# Patient Record
Sex: Male | Born: 1955 | Race: Black or African American | Hispanic: No | Marital: Single | Smoking: Current every day smoker
Health system: Southern US, Community
[De-identification: ages and names within clinical notes are randomized; demographics above are authoritative.]

## PROBLEM LIST (undated history)

## (undated) DIAGNOSIS — F419 Anxiety disorder, unspecified: Secondary | ICD-10-CM

## (undated) DIAGNOSIS — N183 Chronic kidney disease, stage 3 unspecified: Secondary | ICD-10-CM

## (undated) DIAGNOSIS — I10 Essential (primary) hypertension: Secondary | ICD-10-CM

## (undated) HISTORY — DX: Chronic kidney disease, stage 3 unspecified: N18.30

---

## 2003-11-01 ENCOUNTER — Emergency Department (HOSPITAL_COMMUNITY): Admission: EM | Admit: 2003-11-01 | Discharge: 2003-11-01 | Payer: Self-pay | Admitting: *Deleted

## 2004-04-12 ENCOUNTER — Emergency Department (HOSPITAL_COMMUNITY): Admission: EM | Admit: 2004-04-12 | Discharge: 2004-04-12 | Payer: Self-pay | Admitting: Family Medicine

## 2004-04-15 ENCOUNTER — Emergency Department (HOSPITAL_COMMUNITY): Admission: EM | Admit: 2004-04-15 | Discharge: 2004-04-15 | Payer: Self-pay | Admitting: Family Medicine

## 2007-03-04 ENCOUNTER — Emergency Department (HOSPITAL_COMMUNITY): Admission: EM | Admit: 2007-03-04 | Discharge: 2007-03-05 | Payer: Self-pay | Admitting: Emergency Medicine

## 2007-03-05 ENCOUNTER — Ambulatory Visit: Payer: Self-pay | Admitting: *Deleted

## 2007-12-05 ENCOUNTER — Encounter: Admission: RE | Admit: 2007-12-05 | Discharge: 2007-12-05 | Payer: Self-pay | Admitting: Family Medicine

## 2008-07-29 ENCOUNTER — Emergency Department (HOSPITAL_COMMUNITY): Admission: EM | Admit: 2008-07-29 | Discharge: 2008-07-29 | Payer: Self-pay | Admitting: Emergency Medicine

## 2008-11-25 ENCOUNTER — Emergency Department (HOSPITAL_COMMUNITY): Admission: EM | Admit: 2008-11-25 | Discharge: 2008-11-26 | Payer: Self-pay | Admitting: Emergency Medicine

## 2009-09-15 ENCOUNTER — Inpatient Hospital Stay (HOSPITAL_COMMUNITY): Admission: EM | Admit: 2009-09-15 | Discharge: 2009-09-17 | Payer: Self-pay | Admitting: Otolaryngology

## 2009-09-15 ENCOUNTER — Encounter: Payer: Self-pay | Admitting: Emergency Medicine

## 2009-09-27 ENCOUNTER — Ambulatory Visit (HOSPITAL_COMMUNITY): Admission: RE | Admit: 2009-09-27 | Discharge: 2009-09-27 | Payer: Self-pay | Admitting: Otolaryngology

## 2009-10-07 ENCOUNTER — Ambulatory Visit (HOSPITAL_COMMUNITY): Admission: RE | Admit: 2009-10-07 | Discharge: 2009-10-07 | Payer: Self-pay | Admitting: Otolaryngology

## 2009-10-18 ENCOUNTER — Ambulatory Visit (HOSPITAL_BASED_OUTPATIENT_CLINIC_OR_DEPARTMENT_OTHER): Admission: RE | Admit: 2009-10-18 | Discharge: 2009-10-18 | Payer: Self-pay | Admitting: Otolaryngology

## 2010-05-20 LAB — POCT I-STAT, CHEM 8
BUN: 5 mg/dL — ABNORMAL LOW (ref 6–23)
HCT: 45 % (ref 39.0–52.0)
Sodium: 137 mEq/L (ref 135–145)
TCO2: 27 mmol/L (ref 0–100)

## 2010-05-22 LAB — URINALYSIS, ROUTINE W REFLEX MICROSCOPIC
Glucose, UA: NEGATIVE mg/dL
Ketones, ur: NEGATIVE mg/dL
Nitrite: NEGATIVE
Specific Gravity, Urine: 1.018 (ref 1.005–1.030)
pH: 7 (ref 5.0–8.0)

## 2010-05-22 LAB — CBC
MCV: 88.9 fL (ref 78.0–100.0)
Platelets: 229 10*3/uL (ref 150–400)
RBC: 4.44 MIL/uL (ref 4.22–5.81)
RDW: 15.2 % (ref 11.5–15.5)
WBC: 6.5 10*3/uL (ref 4.0–10.5)

## 2010-05-22 LAB — ETHANOL: Alcohol, Ethyl (B): 7 mg/dL (ref 0–10)

## 2010-05-22 LAB — TYPE AND SCREEN
ABO/RH(D): O POS
Antibody Screen: NEGATIVE

## 2010-05-22 LAB — BASIC METABOLIC PANEL
BUN: 19 mg/dL (ref 6–23)
Calcium: 9.5 mg/dL (ref 8.4–10.5)
Chloride: 106 mEq/L (ref 96–112)
Creatinine, Ser: 1.77 mg/dL — ABNORMAL HIGH (ref 0.4–1.5)
GFR calc Af Amer: 49 mL/min — ABNORMAL LOW (ref >60)
GFR calc non Af Amer: 41 mL/min — ABNORMAL LOW (ref >60)

## 2010-05-22 LAB — DIFFERENTIAL
Basophils Absolute: 0 10*3/uL (ref 0.0–0.1)
Lymphocytes Relative: 28 % (ref 12–46)
Lymphs Abs: 1.8 10*3/uL (ref 0.7–4.0)
Neutro Abs: 4.2 10*3/uL (ref 1.7–7.7)
Neutrophils Relative %: 64 % (ref 43–77)

## 2010-05-22 LAB — RAPID URINE DRUG SCREEN, HOSP PERFORMED
Barbiturates: NOT DETECTED
Benzodiazepines: NOT DETECTED
Cocaine: NOT DETECTED

## 2010-05-22 LAB — PROTIME-INR: Prothrombin Time: 14.7 seconds (ref 11.6–15.2)

## 2010-06-10 LAB — CBC
HCT: 41.2 % (ref 39.0–52.0)
MCV: 91.6 fL (ref 78.0–100.0)
Platelets: 220 10*3/uL (ref 150–400)
RDW: 15.2 % (ref 11.5–15.5)

## 2010-06-10 LAB — COMPREHENSIVE METABOLIC PANEL
AST: 16 U/L (ref 0–37)
Albumin: 3.9 g/dL (ref 3.5–5.2)
BUN: 11 mg/dL (ref 6–23)
Calcium: 8.8 mg/dL (ref 8.4–10.5)
Creatinine, Ser: 0.86 mg/dL (ref 0.4–1.5)
GFR calc Af Amer: >60 mL/min (ref >60)
Total Protein: 6.7 g/dL (ref 6.0–8.3)

## 2010-06-10 LAB — DIFFERENTIAL
Basophils Absolute: 0 K/uL (ref 0.0–0.1)
Basophils Relative: 0 % (ref 0–1)
Eosinophils Absolute: 0.3 K/uL (ref 0.0–0.7)
Eosinophils Relative: 3 % (ref 0–5)
Lymphocytes Relative: 19 % (ref 12–46)
Lymphs Abs: 2 K/uL (ref 0.7–4.0)
Monocytes Absolute: 0.5 K/uL (ref 0.1–1.0)
Monocytes Relative: 5 % (ref 3–12)
Neutro Abs: 7.7 K/uL (ref 1.7–7.7)
Neutrophils Relative %: 72 % (ref 43–77)

## 2010-06-10 LAB — URINALYSIS, ROUTINE W REFLEX MICROSCOPIC
Glucose, UA: NEGATIVE mg/dL
Hgb urine dipstick: NEGATIVE
Protein, ur: NEGATIVE mg/dL
Urobilinogen, UA: 1 mg/dL (ref 0.0–1.0)

## 2010-06-14 LAB — DIFFERENTIAL
Basophils Absolute: 0 10*3/uL (ref 0.0–0.1)
Basophils Relative: 0 % (ref 0–1)
Eosinophils Absolute: 0.2 10*3/uL (ref 0.0–0.7)
Monocytes Absolute: 0.5 10*3/uL (ref 0.1–1.0)
Neutro Abs: 6 10*3/uL (ref 1.7–7.7)
Neutrophils Relative %: 77 % (ref 43–77)

## 2010-06-14 LAB — URINALYSIS, ROUTINE W REFLEX MICROSCOPIC
Bilirubin Urine: NEGATIVE
Nitrite: NEGATIVE
Protein, ur: NEGATIVE mg/dL
Specific Gravity, Urine: 1.013 (ref 1.005–1.030)
Urobilinogen, UA: 1 mg/dL (ref 0.0–1.0)

## 2010-06-14 LAB — RAPID URINE DRUG SCREEN, HOSP PERFORMED
Opiates: NOT DETECTED
Tetrahydrocannabinol: NOT DETECTED

## 2010-06-14 LAB — CBC
HCT: 43.8 % (ref 39.0–52.0)
Hemoglobin: 14.6 g/dL (ref 13.0–17.0)
MCV: 91 fL (ref 78.0–100.0)
RBC: 4.81 MIL/uL (ref 4.22–5.81)
WBC: 7.8 10*3/uL (ref 4.0–10.5)

## 2010-06-14 LAB — COMPREHENSIVE METABOLIC PANEL
Alkaline Phosphatase: 40 U/L (ref 39–117)
BUN: 10 mg/dL (ref 6–23)
Chloride: 106 mEq/L (ref 96–112)
Glucose, Bld: 103 mg/dL — ABNORMAL HIGH (ref 70–99)
Potassium: 5.3 mEq/L — ABNORMAL HIGH (ref 3.5–5.1)
Total Bilirubin: 1.3 mg/dL — ABNORMAL HIGH (ref 0.3–1.2)

## 2010-06-14 LAB — POCT CARDIAC MARKERS
CKMB, poc: <1 ng/mL — ABNORMAL LOW (ref 1.0–8.0)
Myoglobin, poc: 24.3 ng/mL (ref 12–200)
Troponin i, poc: <0.05 ng/mL (ref 0.00–0.09)

## 2010-09-22 ENCOUNTER — Emergency Department (HOSPITAL_COMMUNITY)
Admission: EM | Admit: 2010-09-22 | Discharge: 2010-09-22 | Disposition: A | Discharge status: Home / Self Care | Payer: Medicare Other | Attending: Emergency Medicine | Admitting: Emergency Medicine

## 2010-09-22 ENCOUNTER — Emergency Department (HOSPITAL_COMMUNITY): Payer: Medicare Other

## 2010-09-22 DIAGNOSIS — F70 Mild intellectual disabilities: Secondary | ICD-10-CM | POA: Insufficient documentation

## 2010-09-22 DIAGNOSIS — Z87820 Personal history of traumatic brain injury: Secondary | ICD-10-CM | POA: Insufficient documentation

## 2010-09-22 DIAGNOSIS — R079 Chest pain, unspecified: Secondary | ICD-10-CM | POA: Insufficient documentation

## 2010-09-22 DIAGNOSIS — Z79899 Other long term (current) drug therapy: Secondary | ICD-10-CM | POA: Insufficient documentation

## 2010-09-22 DIAGNOSIS — I10 Essential (primary) hypertension: Secondary | ICD-10-CM | POA: Insufficient documentation

## 2010-09-22 DIAGNOSIS — F29 Unspecified psychosis not due to a substance or known physiological condition: Secondary | ICD-10-CM | POA: Insufficient documentation

## 2010-09-22 DIAGNOSIS — D649 Anemia, unspecified: Secondary | ICD-10-CM | POA: Insufficient documentation

## 2010-09-22 DIAGNOSIS — R05 Cough: Secondary | ICD-10-CM | POA: Insufficient documentation

## 2010-09-22 DIAGNOSIS — R059 Cough, unspecified: Secondary | ICD-10-CM | POA: Insufficient documentation

## 2014-09-26 ENCOUNTER — Emergency Department (HOSPITAL_COMMUNITY)
Admission: EM | Admit: 2014-09-26 | Discharge: 2014-09-26 | Disposition: A | Discharge status: Home / Self Care | Payer: Medicare Other | Attending: Emergency Medicine | Admitting: Emergency Medicine

## 2014-09-26 ENCOUNTER — Encounter (HOSPITAL_COMMUNITY): Payer: Self-pay | Admitting: Emergency Medicine

## 2014-09-26 DIAGNOSIS — S3992XA Unspecified injury of lower back, initial encounter: Secondary | ICD-10-CM | POA: Diagnosis present

## 2014-09-26 DIAGNOSIS — Y92410 Unspecified street and highway as the place of occurrence of the external cause: Secondary | ICD-10-CM | POA: Insufficient documentation

## 2014-09-26 DIAGNOSIS — Y9389 Activity, other specified: Secondary | ICD-10-CM | POA: Insufficient documentation

## 2014-09-26 DIAGNOSIS — Y998 Other external cause status: Secondary | ICD-10-CM | POA: Diagnosis not present

## 2014-09-26 MED ORDER — IBUPROFEN 800 MG PO TABS: 800.0000 mg | ORAL_TABLET | Freq: Three times a day (TID) | ORAL | Status: DC

## 2014-09-26 MED ORDER — IBUPROFEN 800 MG PO TABS
800.0000 mg | ORAL_TABLET | Freq: Once | ORAL | Status: AC
  Administered 2014-09-26: 800 mg via ORAL
  Filled 2014-09-26: qty 1

## 2014-09-26 MED ORDER — METHOCARBAMOL 500 MG PO TABS: 500.0000 mg | ORAL_TABLET | Freq: Two times a day (BID) | ORAL | Status: DC
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
## 2014-09-26 NOTE — Discharge Instructions (Signed)

## 2015-03-17 ENCOUNTER — Other Ambulatory Visit: Payer: Self-pay | Admitting: Family Medicine

## 2015-03-17 ENCOUNTER — Ambulatory Visit
Admission: RE | Admit: 2015-03-17 | Discharge: 2015-03-17 | Disposition: A | Discharge status: Home / Self Care | Payer: Medicare Other | Source: Ambulatory Visit | Attending: Family Medicine | Admitting: Family Medicine

## 2015-03-17 DIAGNOSIS — M25511 Pain in right shoulder: Secondary | ICD-10-CM

## 2015-06-08 ENCOUNTER — Observation Stay (HOSPITAL_COMMUNITY)
Admission: EM | Admit: 2015-06-08 | Discharge: 2015-06-09 | Disposition: A | Discharge status: Home / Self Care | Payer: Medicare Other | Attending: Internal Medicine | Admitting: Internal Medicine

## 2015-06-08 ENCOUNTER — Emergency Department (HOSPITAL_COMMUNITY): Payer: Medicare Other

## 2015-06-08 ENCOUNTER — Encounter (HOSPITAL_COMMUNITY): Payer: Self-pay | Admitting: Emergency Medicine

## 2015-06-08 DIAGNOSIS — N179 Acute kidney failure, unspecified: Secondary | ICD-10-CM | POA: Diagnosis not present

## 2015-06-08 DIAGNOSIS — N189 Chronic kidney disease, unspecified: Secondary | ICD-10-CM | POA: Diagnosis not present

## 2015-06-08 DIAGNOSIS — I129 Hypertensive chronic kidney disease with stage 1 through stage 4 chronic kidney disease, or unspecified chronic kidney disease: Secondary | ICD-10-CM | POA: Insufficient documentation

## 2015-06-08 DIAGNOSIS — G9340 Encephalopathy, unspecified: Secondary | ICD-10-CM | POA: Diagnosis not present

## 2015-06-08 DIAGNOSIS — E872 Acidosis, unspecified: Secondary | ICD-10-CM

## 2015-06-08 DIAGNOSIS — R4182 Altered mental status, unspecified: Secondary | ICD-10-CM | POA: Diagnosis present

## 2015-06-08 DIAGNOSIS — I959 Hypotension, unspecified: Secondary | ICD-10-CM | POA: Diagnosis not present

## 2015-06-08 DIAGNOSIS — N390 Urinary tract infection, site not specified: Secondary | ICD-10-CM | POA: Insufficient documentation

## 2015-06-08 DIAGNOSIS — I10 Essential (primary) hypertension: Secondary | ICD-10-CM | POA: Diagnosis present

## 2015-06-08 DIAGNOSIS — R8281 Pyuria: Secondary | ICD-10-CM | POA: Diagnosis present

## 2015-06-08 DIAGNOSIS — N289 Disorder of kidney and ureter, unspecified: Secondary | ICD-10-CM

## 2015-06-08 HISTORY — DX: Essential (primary) hypertension: I10

## 2015-06-08 LAB — CBC WITH DIFFERENTIAL/PLATELET
Basophils Absolute: 0 10*3/uL (ref 0.0–0.1)
Basophils Relative: 0 %
EOS ABS: 0.1 10*3/uL (ref 0.0–0.7)
EOS PCT: 1 %
HCT: 42.8 % (ref 39.0–52.0)
Hemoglobin: 14.2 g/dL (ref 13.0–17.0)
LYMPHS ABS: 2.5 10*3/uL (ref 0.7–4.0)
Lymphocytes Relative: 25 %
MCH: 29.4 pg (ref 26.0–34.0)
MCHC: 33.2 g/dL (ref 30.0–36.0)
MCV: 88.6 fL (ref 78.0–100.0)
Monocytes Absolute: 0.5 10*3/uL (ref 0.1–1.0)
Monocytes Relative: 5 %
Neutro Abs: 6.7 10*3/uL (ref 1.7–7.7)
Neutrophils Relative %: 69 %
PLATELETS: 271 10*3/uL (ref 150–400)
RBC: 4.83 MIL/uL (ref 4.22–5.81)
RDW: 15 % (ref 11.5–15.5)
WBC: 9.9 10*3/uL (ref 4.0–10.5)

## 2015-06-08 LAB — COMPREHENSIVE METABOLIC PANEL
ALK PHOS: 51 U/L (ref 38–126)
ALT: 21 U/L (ref 17–63)
AST: 22 U/L (ref 15–41)
Albumin: 4.7 g/dL (ref 3.5–5.0)
Anion gap: 11 (ref 5–15)
BILIRUBIN TOTAL: 0.7 mg/dL (ref 0.3–1.2)
BUN: 23 mg/dL — AB (ref 6–20)
CALCIUM: 9.9 mg/dL (ref 8.9–10.3)
CHLORIDE: 108 mmol/L (ref 101–111)
CO2: 22 mmol/L (ref 22–32)
CREATININE: 2.04 mg/dL — AB (ref 0.61–1.24)
GFR calc Af Amer: 40 mL/min — ABNORMAL LOW (ref >60)
GFR, EST NON AFRICAN AMERICAN: 34 mL/min — AB (ref >60)
Glucose, Bld: 130 mg/dL — ABNORMAL HIGH (ref 65–99)
Potassium: 4.4 mmol/L (ref 3.5–5.1)
Sodium: 141 mmol/L (ref 135–145)
Total Protein: 7.8 g/dL (ref 6.5–8.1)

## 2015-06-08 LAB — LACTIC ACID, PLASMA
LACTIC ACID, VENOUS: 2.4 mmol/L — AB (ref 0.5–2.0)
Lactic Acid, Venous: 1.1 mmol/L (ref 0.5–2.0)

## 2015-06-08 LAB — ETHANOL: Alcohol, Ethyl (B): 12 mg/dL — ABNORMAL HIGH (ref <5)

## 2015-06-08 LAB — CBG MONITORING, ED: GLUCOSE-CAPILLARY: 136 mg/dL — AB (ref 65–99)

## 2015-06-08 NOTE — ED Notes (Signed)
Pt in CT.
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
## 2015-06-08 NOTE — ED Notes (Signed)
Bed: RESA Expected date:  Expected time:  Means of arrival:  Comments: EMS 60yo M N/V syncope

## 2015-06-08 NOTE — ED Notes (Signed)
MD at bedside. 

## 2015-06-08 NOTE — ED Notes (Signed)
Pt reminded of need for urine sample.  

## 2015-06-09 ENCOUNTER — Encounter (HOSPITAL_COMMUNITY): Payer: Self-pay | Admitting: Nephrology

## 2015-06-09 DIAGNOSIS — R8281 Pyuria: Secondary | ICD-10-CM | POA: Diagnosis present

## 2015-06-09 DIAGNOSIS — R4182 Altered mental status, unspecified: Secondary | ICD-10-CM | POA: Diagnosis not present

## 2015-06-09 DIAGNOSIS — N289 Disorder of kidney and ureter, unspecified: Secondary | ICD-10-CM

## 2015-06-09 DIAGNOSIS — N390 Urinary tract infection, site not specified: Secondary | ICD-10-CM

## 2015-06-09 DIAGNOSIS — I9589 Other hypotension: Secondary | ICD-10-CM

## 2015-06-09 DIAGNOSIS — I10 Essential (primary) hypertension: Secondary | ICD-10-CM

## 2015-06-09 DIAGNOSIS — G9340 Encephalopathy, unspecified: Secondary | ICD-10-CM

## 2015-06-09 DIAGNOSIS — N179 Acute kidney failure, unspecified: Secondary | ICD-10-CM

## 2015-06-09 LAB — URINALYSIS, ROUTINE W REFLEX MICROSCOPIC
Bilirubin Urine: NEGATIVE
Glucose, UA: NEGATIVE mg/dL
Ketones, ur: NEGATIVE mg/dL
NITRITE: NEGATIVE
PROTEIN: NEGATIVE mg/dL
SPECIFIC GRAVITY, URINE: 1.021 (ref 1.005–1.030)
pH: 5.5 (ref 5.0–8.0)

## 2015-06-09 LAB — BASIC METABOLIC PANEL
Anion gap: 5 (ref 5–15)
BUN: 19 mg/dL (ref 6–20)
CO2: 23 mmol/L (ref 22–32)
CREATININE: 1.33 mg/dL — AB (ref 0.61–1.24)
Calcium: 9 mg/dL (ref 8.9–10.3)
Chloride: 112 mmol/L — ABNORMAL HIGH (ref 101–111)
GFR calc Af Amer: >60 mL/min (ref >60)
GFR, EST NON AFRICAN AMERICAN: 58 mL/min — AB (ref >60)
Glucose, Bld: 159 mg/dL — ABNORMAL HIGH (ref 65–99)
POTASSIUM: 4.3 mmol/L (ref 3.5–5.1)
SODIUM: 140 mmol/L (ref 135–145)

## 2015-06-09 LAB — CBC
HCT: 36.6 % — ABNORMAL LOW (ref 39.0–52.0)
Hemoglobin: 12.3 g/dL — ABNORMAL LOW (ref 13.0–17.0)
MCH: 29.2 pg (ref 26.0–34.0)
MCHC: 33.6 g/dL (ref 30.0–36.0)
MCV: 86.9 fL (ref 78.0–100.0)
PLATELETS: 220 10*3/uL (ref 150–400)
RBC: 4.21 MIL/uL — ABNORMAL LOW (ref 4.22–5.81)
RDW: 15 % (ref 11.5–15.5)
WBC: 11 10*3/uL — AB (ref 4.0–10.5)

## 2015-06-09 LAB — URINE MICROSCOPIC-ADD ON

## 2015-06-09 LAB — RAPID URINE DRUG SCREEN, HOSP PERFORMED
Amphetamines: NOT DETECTED
BARBITURATES: NOT DETECTED
Benzodiazepines: NOT DETECTED
COCAINE: NOT DETECTED
Opiates: NOT DETECTED
Tetrahydrocannabinol: POSITIVE — AB

## 2015-06-09 MED ORDER — ONDANSETRON HCL 4 MG/2ML IJ SOLN: 4.0000 mg | Freq: Four times a day (QID) | INTRAMUSCULAR | Status: DC | PRN

## 2015-06-09 MED ORDER — BISACODYL 5 MG PO TBEC: 5.0000 mg | DELAYED_RELEASE_TABLET | Freq: Every day | ORAL | Status: DC | PRN

## 2015-06-09 MED ORDER — ACETAMINOPHEN 325 MG PO TABS: 650.0000 mg | ORAL_TABLET | Freq: Four times a day (QID) | ORAL | Status: DC | PRN

## 2015-06-09 MED ORDER — FLUOXETINE HCL 20 MG PO CAPS
40.0000 mg | ORAL_CAPSULE | Freq: Every day | ORAL | Status: DC
  Administered 2015-06-09: 40 mg via ORAL
  Filled 2015-06-09: qty 2

## 2015-06-09 MED ORDER — SODIUM CHLORIDE 0.45 % IV SOLN
INTRAVENOUS | Status: DC
  Administered 2015-06-09: 150 mL/h via INTRAVENOUS

## 2015-06-09 MED ORDER — ENOXAPARIN SODIUM 40 MG/0.4ML ~~LOC~~ SOLN
40.0000 mg | SUBCUTANEOUS | Status: DC
  Filled 2015-06-09: qty 0.4

## 2015-06-09 MED ORDER — SODIUM CHLORIDE 0.9 % IV BOLUS (SEPSIS)
1000.0000 mL | Freq: Once | INTRAVENOUS | Status: AC
  Administered 2015-06-09: 1000 mL via INTRAVENOUS

## 2015-06-09 MED ORDER — ONDANSETRON HCL 4 MG PO TABS: 4.0000 mg | ORAL_TABLET | Freq: Four times a day (QID) | ORAL | Status: DC | PRN

## 2015-06-09 MED ORDER — SODIUM CHLORIDE 0.9 % IV BOLUS (SEPSIS): 500.0000 mL | Freq: Once | INTRAVENOUS | Status: DC

## 2015-06-09 MED ORDER — POLYETHYLENE GLYCOL 3350 17 G PO PACK: 17.0000 g | PACK | Freq: Every day | ORAL | Status: DC | PRN

## 2015-06-09 MED ORDER — ACETAMINOPHEN 650 MG RE SUPP: 650.0000 mg | Freq: Four times a day (QID) | RECTAL | Status: DC | PRN
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
## 2015-06-09 NOTE — Progress Notes (Signed)
06/08/2015 A. Alric Geise RNCM late entry.  Patient confirms his pcp is Dr. Pecola Leisureeese.  System updated.

## 2015-06-09 NOTE — ED Provider Notes (Signed)
CSN: [id number]     Arrival date & time 06/08/15  2009 History   First MD Initiated Contact with Patient 06/08/15 2021     Chief Complaint  Patient presents with  . Altered Mental Status     (Consider location/radiation/quality/duration/timing/severity/associated sxs/prior Treatment) Patient is a 60 y.o. male presenting with altered mental status.  Altered Mental Status Presenting symptoms: behavior changes and confusion   Severity:  Mild Most recent episode:  Today Episode history:  Single Timing:  Constant Chronicity:  New Context: alcohol use and drug use   Associated symptoms: vomiting   Associated symptoms: no abdominal pain, no fever and no headaches     Past Medical History  Diagnosis Date  . Hypertension    History reviewed. No pertinent past surgical history. No family history on file. Social History  Substance Use Topics  . Smoking status: Never Smoker   . Smokeless tobacco: None  . Alcohol Use: No    Review of Systems  Constitutional: Negative for fever, chills and activity change.  HENT: Negative for congestion and rhinorrhea.   Eyes: Negative for visual disturbance.  Respiratory: Negative for cough and shortness of breath.   Cardiovascular: Negative for chest pain.  Gastrointestinal: Positive for vomiting. Negative for abdominal pain, diarrhea and constipation.  Endocrine: Negative for polyuria.  Genitourinary: Negative for dysuria and flank pain.  Musculoskeletal: Negative for back pain and neck pain.  Skin: Negative for wound.  Neurological: Negative for headaches.  Psychiatric/Behavioral: Positive for confusion.      Allergies  Review of patient's allergies indicates no known allergies.  Home Medications   Prior to Admission medications   Medication Sig Start Date End Date Taking? Authorizing Provider  FLUoxetine (PROZAC) 40 MG capsule Take 40 mg by mouth daily. 06/01/15  Yes Historical Provider, MD  lisinopril (PRINIVIL,ZESTRIL) 20 MG tablet  Take 20 mg by mouth daily. 05/20/15  Yes Historical Provider, MD   BP 128/83 mmHg  Pulse 77  Temp(Src) 98.3 F (36.8 C) (Oral)  Resp 18  SpO2 100% Physical Exam  Constitutional: He appears well-developed and well-nourished.  HENT:  Head: Normocephalic and atraumatic.  Eyes: Conjunctivae are normal. Pupils are equal, round, and reactive to light.  Neck: Normal range of motion.  Cardiovascular: Normal rate.   Pulmonary/Chest: Effort normal. No respiratory distress. He has no wheezes.  Abdominal: Soft. He exhibits no distension.  Musculoskeletal: Normal range of motion. He exhibits no edema or tenderness.  Neurological: No cranial nerve deficit.  Skin: Skin is warm and dry.  Nursing note and vitals reviewed.   ED Course  Procedures (including critical care time) Labs Review Labs Reviewed  COMPREHENSIVE METABOLIC PANEL - Abnormal; Notable for the following:    Glucose, Bld 130 (*)    BUN 23 (*)    Creatinine, Ser 2.04 (*)    GFR calc non Af Amer 34 (*)    GFR calc Af Amer 40 (*)    All other components within normal limits  LACTIC ACID, PLASMA - Abnormal; Notable for the following:    Lactic Acid, Venous 2.4 (*)    All other components within normal limits  URINE RAPID DRUG SCREEN, HOSP PERFORMED - Abnormal; Notable for the following:    Tetrahydrocannabinol POSITIVE (*)    All other components within normal limits  URINALYSIS, ROUTINE W REFLEX MICROSCOPIC (NOT AT [facility]) - Abnormal; Notable for the following:    APPearance CLOUDY (*)    Hgb urine dipstick TRACE (*)    Leukocytes, UA TRACE (*)  All other components within normal limits  ETHANOL - Abnormal; Notable for the following:    Alcohol, Ethyl (B) 12 (*)    All other components within normal limits  URINE MICROSCOPIC-ADD ON - Abnormal; Notable for the following:    Squamous Epithelial / LPF 0-5 (*)    Bacteria, UA FEW (*)    Casts HYALINE CASTS (*)    All other components within normal limits  CBG MONITORING, ED  - Abnormal; Notable for the following:    Glucose-Capillary 136 (*)    All other components within normal limits  URINE CULTURE  CBC WITH DIFFERENTIAL/PLATELET  LACTIC ACID, PLASMA  BASIC METABOLIC PANEL  CBC  CBG MONITORING, ED    Imaging Review Dg Shoulder Right  06/08/2015  CLINICAL DATA:  Right shoulder pain. EXAM: RIGHT SHOULDER - 2+ VIEW COMPARISON:  None. FINDINGS: Negative for acute fracture dislocation. No bone lesion or bony destruction. There is moderate arthritic change of the [facility] joint. The glenohumeral articulation is much better preserved. IMPRESSION: Negative for acute fracture or dislocation Electronically Signed   By: Ellery Plunkaniel R Mitchell M.D.   On: 06/08/2015 21:02   Ct Head Wo Contrast  06/08/2015  CLINICAL DATA:  Change in mental status and vomiting. EXAM: CT HEAD WITHOUT CONTRAST TECHNIQUE: Contiguous axial images were obtained from the base of the skull through the vertex without intravenous contrast. COMPARISON:  09/15/2009 FINDINGS: The brain demonstrates no evidence of hemorrhage, infarction, edema, mass effect, extra-axial fluid collection, hydrocephalus or mass lesion. The skull is unremarkable. IMPRESSION: Normal head CT. Electronically Signed   By: Irish LackGlenn  Yamagata M.D.   On: 06/08/2015 20:56   I have personally reviewed and evaluated these images and lab results as part of my medical decision-making.   EKG Interpretation None      MDM   Final diagnoses:  AKI (acute kidney injury) (HCC)  Altered mental status, unspecified altered mental status type  Hypotension, unspecified hypotension type  Lactic acidosis    Here with AMS, initially low BP that resolved with fluids. Slight AKI, lactic acidosis. MS improved prior to admission but still groggy. No fever to suggest meningitis. Ct negative for bleed. Possibly just drug related.    Marily MemosJason Dellar Traber, MD 06/09/15 [phone]
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
## 2015-06-09 NOTE — Progress Notes (Signed)
Pt's vitals are WNL, tolerating diet and pain is under control. Discussed discharge instructions with patient. Discharged to home.  

## 2015-06-10 LAB — URINE CULTURE

## 2015-12-07 LAB — GLUCOSE, POCT (MANUAL RESULT ENTRY): POC GLUCOSE: 92 mg/dL (ref 70–99)

## 2016-07-24 IMAGING — CR DG SHOULDER 2+V*R*
2 series · 2 of 2 positions shown · non-contrast
Comparison: None.

CLINICAL DATA: Right shoulder pain.

EXAM:
RIGHT SHOULDER - 2+ VIEW

[x shoulder ap right (1 of 2)]
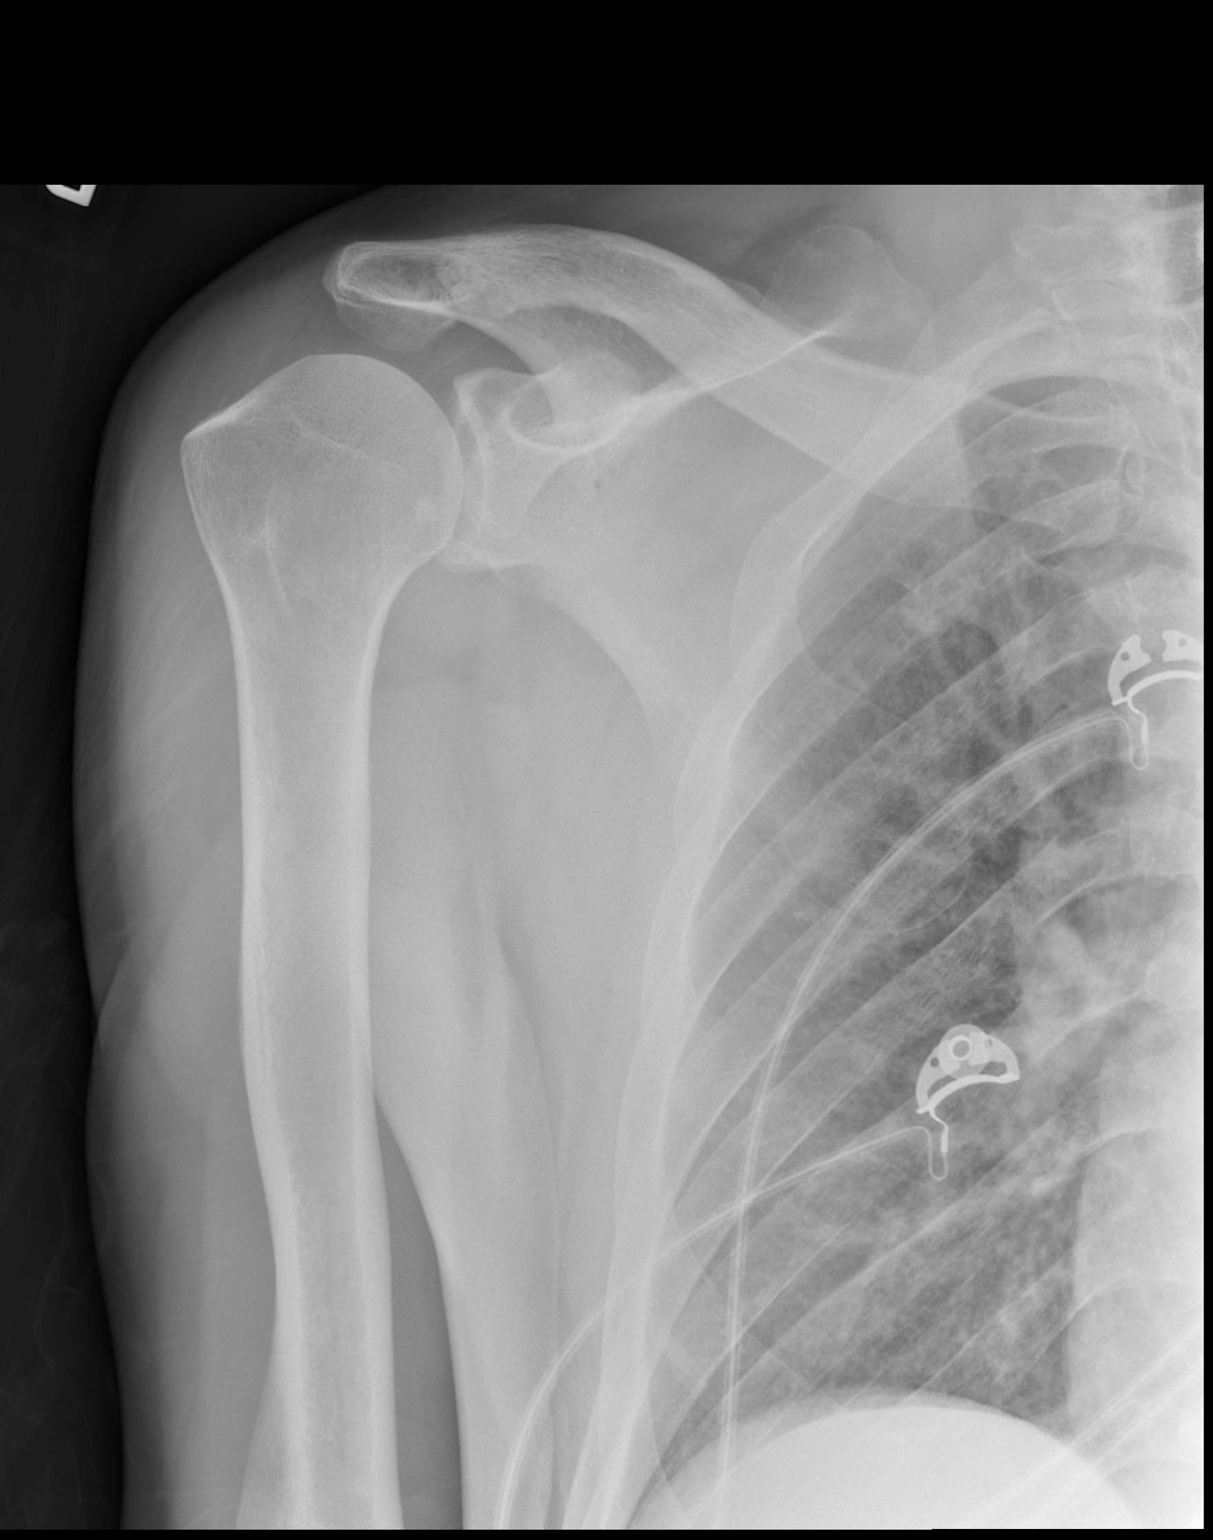

[x shoulder ap right (2 of 2)]
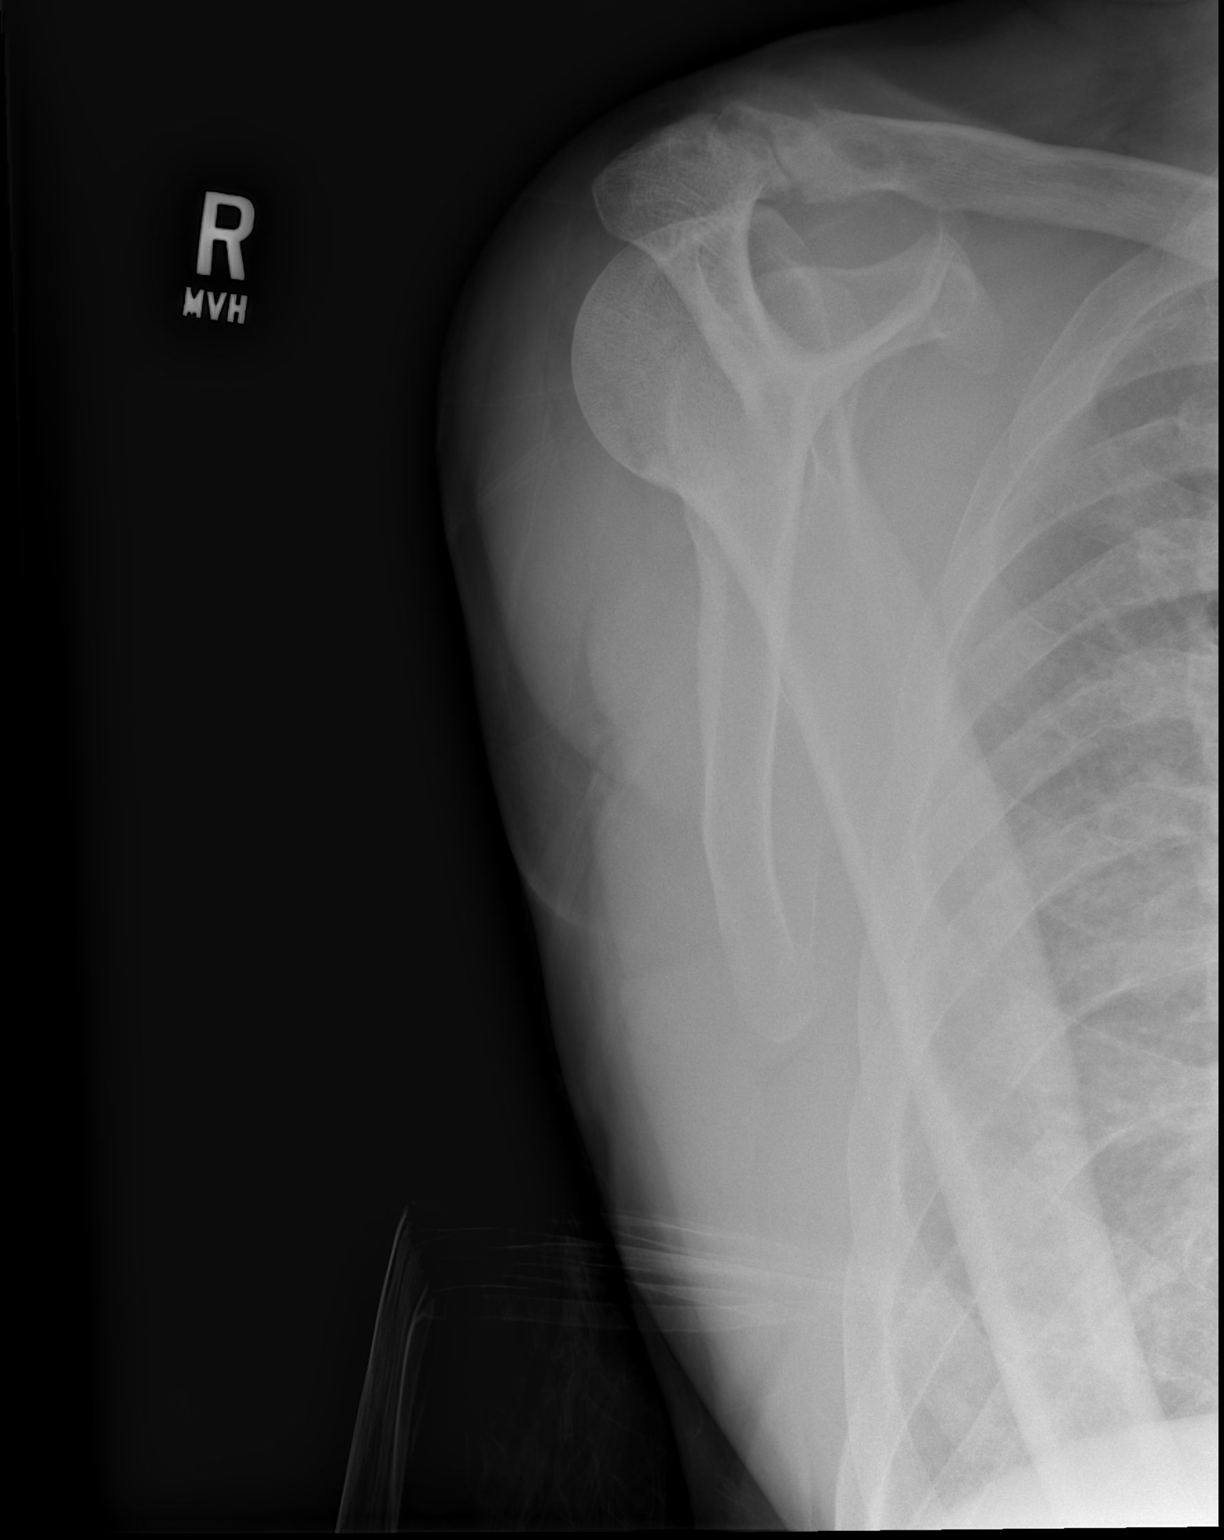

[2 of 2 positions shown; findings below may reference images not displayed]

FINDINGS: Negative for acute fracture dislocation. No bone lesion or bony
destruction. There is moderate arthritic change of the AC joint. The
glenohumeral articulation is much better preserved.
IMPRESSION: Negative for acute fracture or dislocation

## 2016-07-24 IMAGING — CT CT HEAD W/O CM
2 series · 17 of 30 positions shown, 20 images · non-contrast
Comparison: 09/15/2009

CLINICAL DATA: Change in mental status and vomiting.

EXAM:
CT HEAD WITHOUT CONTRAST
TECHNIQUE: Contiguous axial images were obtained from the base of the skull
through the vertex without intravenous contrast.

[Series 2: head w/o · axial · non-contrast · 0.42mm/px · z∈[-266,-146]mm · 9 of 32 slices shown, 12 images]
[im 4/32  brain]
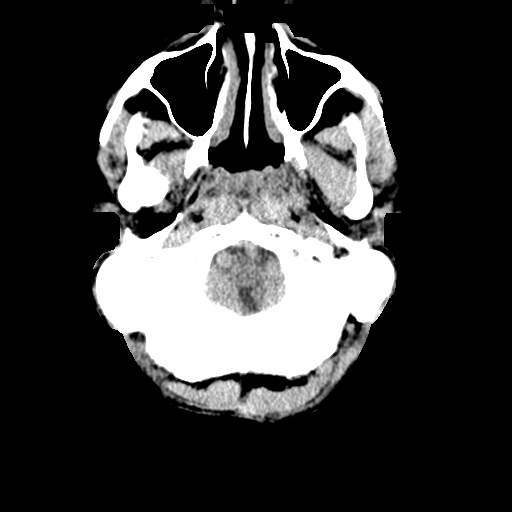
[im 4/32  bone]
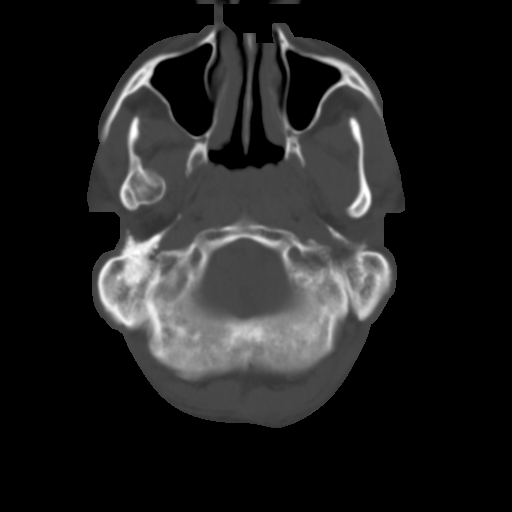
[im 7/32  brain]
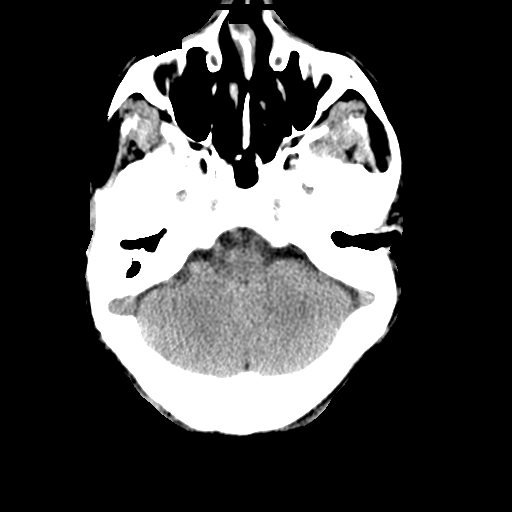
[im 10/32  brain]
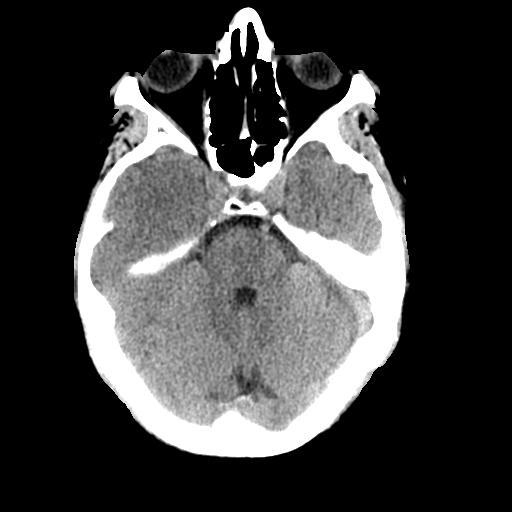
[im 13/32  brain]
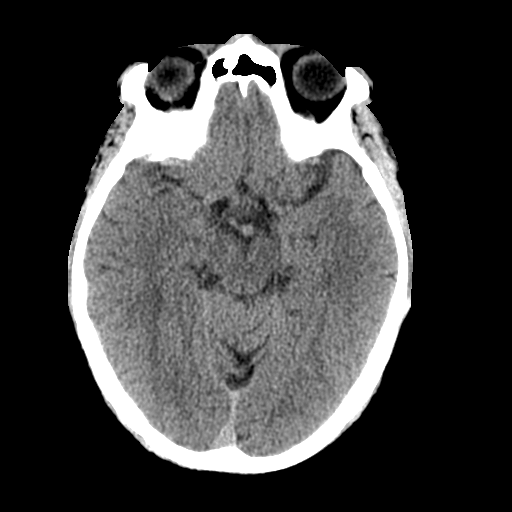
[im 16/32  brain]
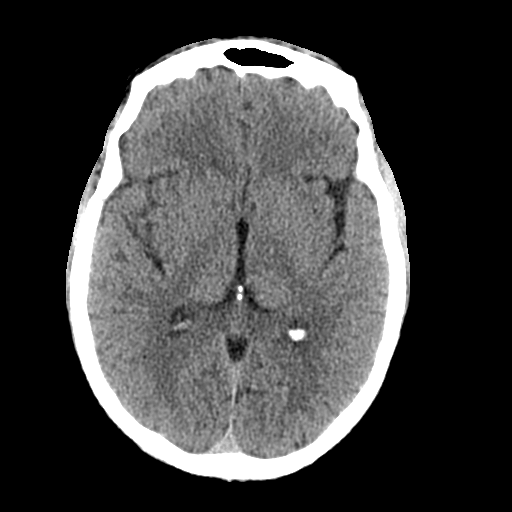
[im 16/32  bone]
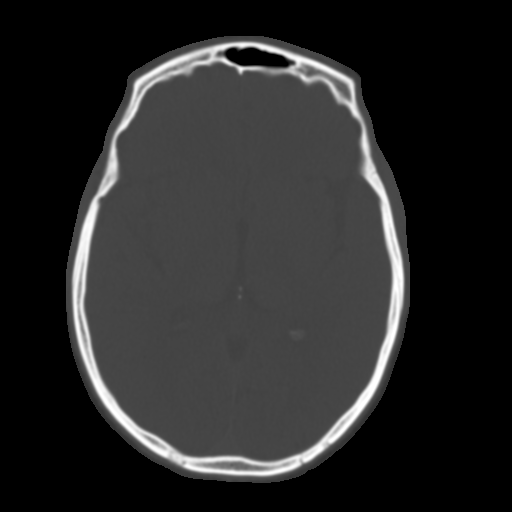
[im 19/32  brain]
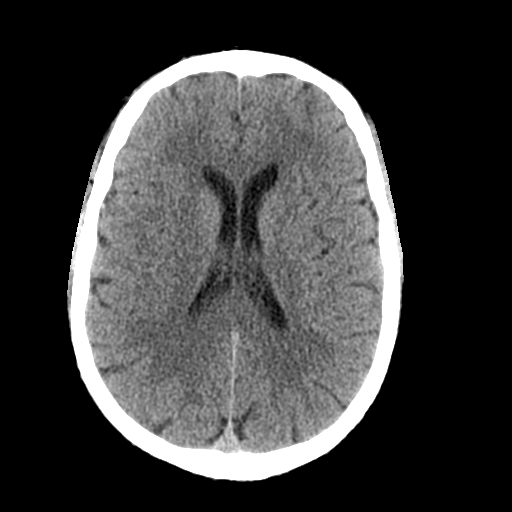
[im 22/32  brain]
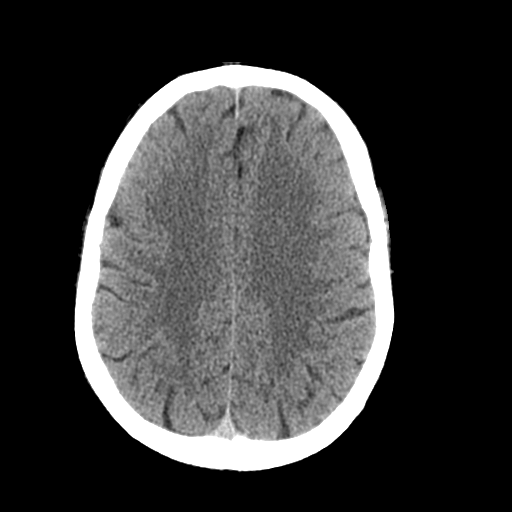
[im 25/32  brain]
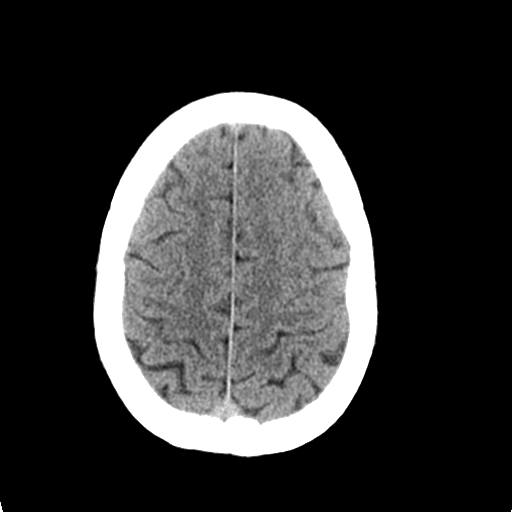
[im 28/32  brain]
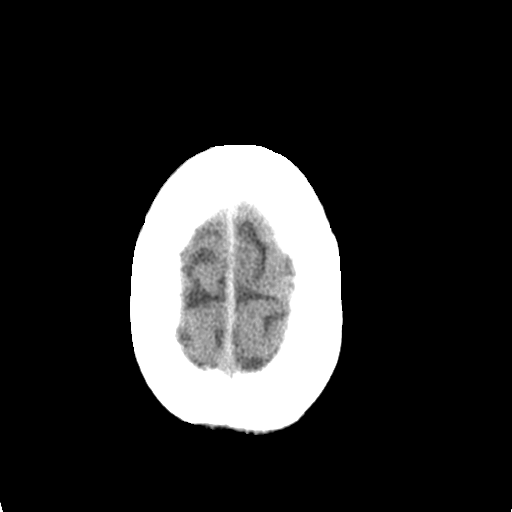
[im 28/32  bone]
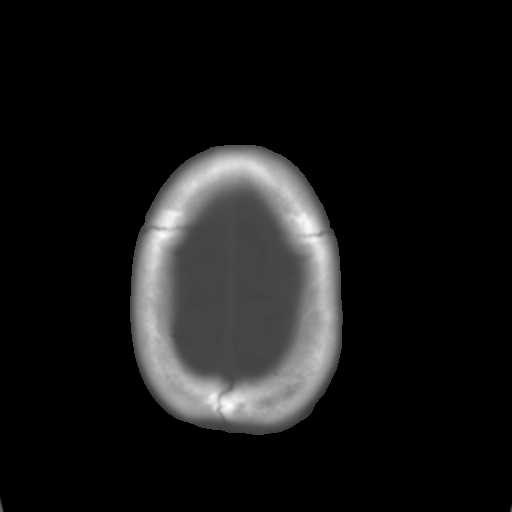

[Series 3: bone windows · axial · 0.42mm/px · z∈[-266,-140]mm · 8 of 54 slices shown]
[im 6/54  bone]
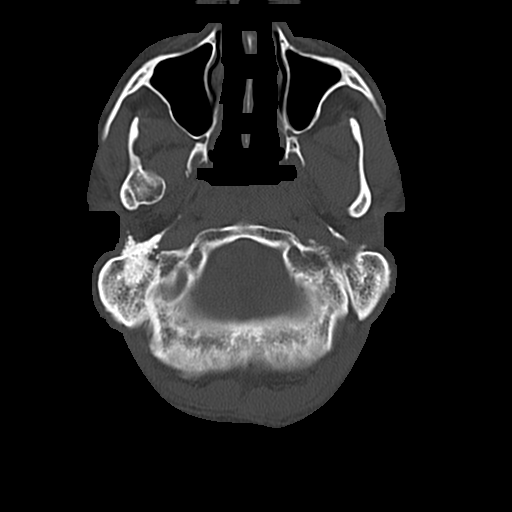
[im 12/54  bone]
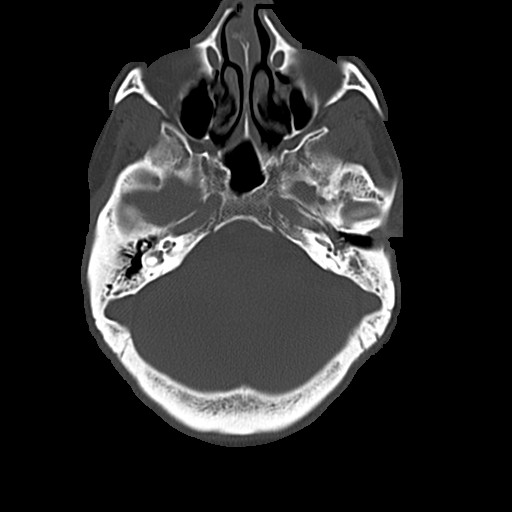
[im 18/54  bone]
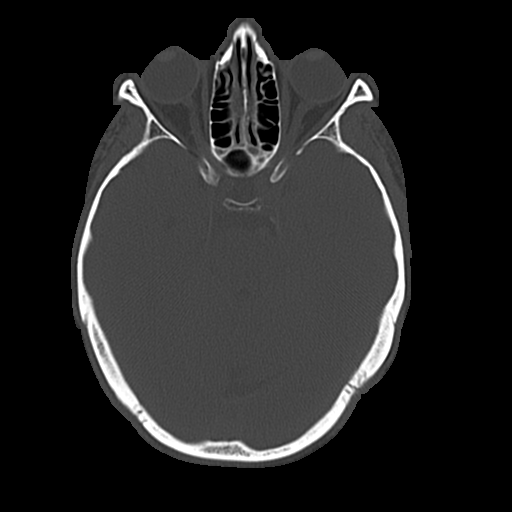
[im 24/54  bone]
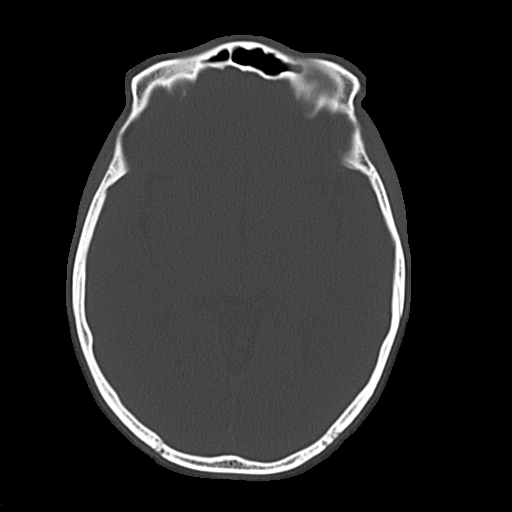
[im 30/54  bone]
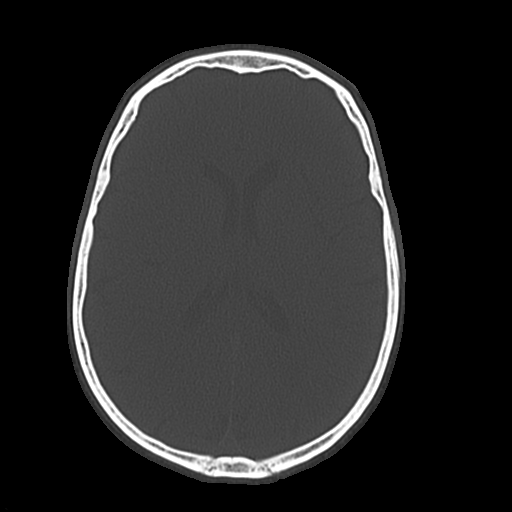
[im 36/54  bone]
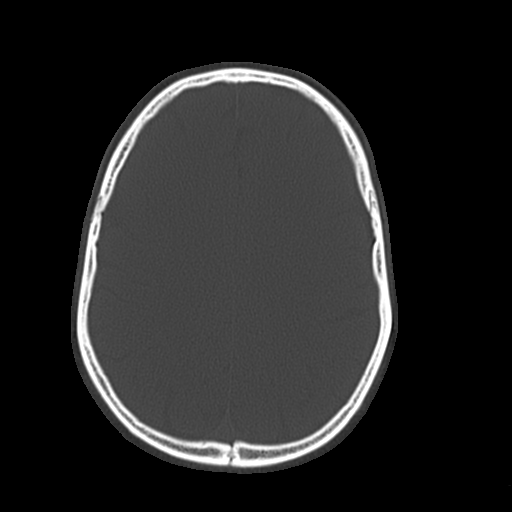
[im 42/54  bone]
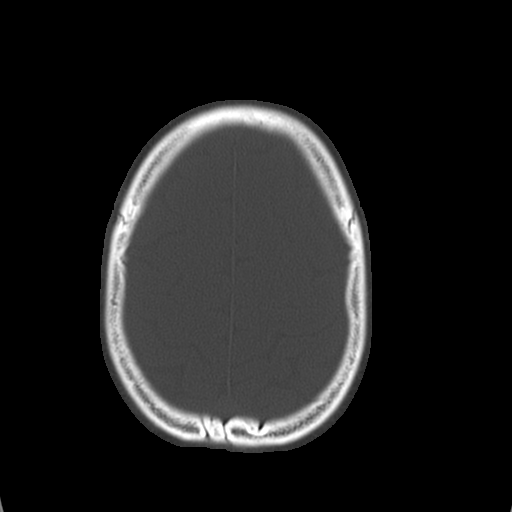
[im 48/54  bone]
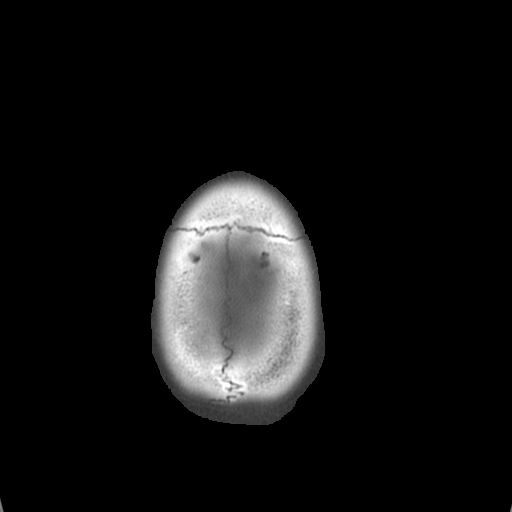

[17 of 30 positions shown; findings below may reference images not displayed]

FINDINGS: The brain demonstrates no evidence of hemorrhage, infarction, edema,
mass effect, extra-axial fluid collection, hydrocephalus or mass
lesion. The skull is unremarkable.
IMPRESSION: Normal head CT.

## 2016-08-20 ENCOUNTER — Emergency Department (HOSPITAL_COMMUNITY): Payer: Medicare Other

## 2016-08-20 ENCOUNTER — Encounter (HOSPITAL_COMMUNITY): Payer: Self-pay | Admitting: Emergency Medicine

## 2016-08-20 ENCOUNTER — Emergency Department (HOSPITAL_COMMUNITY)
Admission: EM | Admit: 2016-08-20 | Discharge: 2016-08-20 | Disposition: A | Discharge status: Home / Self Care | Payer: Medicare Other | Attending: Emergency Medicine | Admitting: Emergency Medicine

## 2016-08-20 DIAGNOSIS — R55 Syncope and collapse: Secondary | ICD-10-CM | POA: Insufficient documentation

## 2016-08-20 DIAGNOSIS — T671XXA Heat syncope, initial encounter: Secondary | ICD-10-CM

## 2016-08-20 DIAGNOSIS — I10 Essential (primary) hypertension: Secondary | ICD-10-CM | POA: Diagnosis not present

## 2016-08-20 DIAGNOSIS — Z79899 Other long term (current) drug therapy: Secondary | ICD-10-CM | POA: Insufficient documentation

## 2016-08-20 HISTORY — DX: Anxiety disorder, unspecified: F41.9

## 2016-08-20 LAB — CBC WITH DIFFERENTIAL/PLATELET
BASOS ABS: 0 10*3/uL (ref 0.0–0.1)
BASOS PCT: 0 %
Eosinophils Absolute: 0.2 10*3/uL (ref 0.0–0.7)
Eosinophils Relative: 3 %
HEMATOCRIT: 41.1 % (ref 39.0–52.0)
Hemoglobin: 13.6 g/dL (ref 13.0–17.0)
LYMPHS PCT: 23 %
Lymphs Abs: 2.1 10*3/uL (ref 0.7–4.0)
MCH: 29.6 pg (ref 26.0–34.0)
MCHC: 33.1 g/dL (ref 30.0–36.0)
MCV: 89.5 fL (ref 78.0–100.0)
MONO ABS: 0.3 10*3/uL (ref 0.1–1.0)
Monocytes Relative: 4 %
NEUTROS ABS: 6.4 10*3/uL (ref 1.7–7.7)
Neutrophils Relative %: 70 %
Platelets: 237 10*3/uL (ref 150–400)
RBC: 4.59 MIL/uL (ref 4.22–5.81)
RDW: 15.4 % (ref 11.5–15.5)
WBC: 9.1 10*3/uL (ref 4.0–10.5)

## 2016-08-20 LAB — COMPREHENSIVE METABOLIC PANEL
ALT: 14 U/L — ABNORMAL LOW (ref 17–63)
ANION GAP: 8 (ref 5–15)
AST: 20 U/L (ref 15–41)
Albumin: 3.8 g/dL (ref 3.5–5.0)
Alkaline Phosphatase: 47 U/L (ref 38–126)
BILIRUBIN TOTAL: 0.8 mg/dL (ref 0.3–1.2)
BUN: 11 mg/dL (ref 6–20)
CO2: 22 mmol/L (ref 22–32)
Calcium: 9.5 mg/dL (ref 8.9–10.3)
Chloride: 110 mmol/L (ref 101–111)
Creatinine, Ser: 1.29 mg/dL — ABNORMAL HIGH (ref 0.61–1.24)
GFR calc Af Amer: >60 mL/min (ref >60)
GFR, EST NON AFRICAN AMERICAN: 60 mL/min — AB (ref >60)
Glucose, Bld: 107 mg/dL — ABNORMAL HIGH (ref 65–99)
POTASSIUM: 3.7 mmol/L (ref 3.5–5.1)
Sodium: 140 mmol/L (ref 135–145)
TOTAL PROTEIN: 7 g/dL (ref 6.5–8.1)

## 2016-08-20 LAB — I-STAT TROPONIN, ED: TROPONIN I, POC: 0 ng/mL (ref 0.00–0.08)

## 2016-08-20 LAB — CBG MONITORING, ED: GLUCOSE-CAPILLARY: 130 mg/dL — AB (ref 65–99)

## 2016-08-20 LAB — CK: CK TOTAL: 210 U/L (ref 49–397)

## 2016-08-20 MED ORDER — SODIUM CHLORIDE 0.9 % IV BOLUS (SEPSIS)
2000.0000 mL | Freq: Once | INTRAVENOUS | Status: AC
  Administered 2016-08-20: 2000 mL via INTRAVENOUS

## 2016-08-20 NOTE — ED Triage Notes (Signed)
EMS called out to [facility] where pt was found to be c/o abd pain, cool, clammy, pale with weak brady pulses; pt was loaded on to EMS stretcher, placed in trendelenburg and given 250cc NS with improvement of symptoms; pt also c/o "bite" to L antecubital space; pt alert and oriented, talking on cell phone during triage assessment

## 2016-08-20 NOTE — ED Provider Notes (Signed)
[facility]PT Provider Note   CSN: [id number] Arrival date & time: 08/20/16  1956     History   Chief Complaint Chief Complaint  Patient presents with  . Near Syncope  . Bradycardia    HPI Roy Chavez is a 61 y.o. male.  The patient had been walking in the hot sun for about 2 hours. Still dizzy and passed out. Patient was brought to the emergency department for evaluation    Loss of Consciousness   This is a new problem. The current episode started less than 1 hour ago. The problem occurs rarely. The problem has been resolved. There was no loss of consciousness. The problem is associated with normal activity. Associated symptoms include dizziness. Pertinent negatives include abdominal pain, back pain, chest pain, congestion, headaches, seizures and visual change.    Past Medical History:  Diagnosis Date  . Anxiety   . Hypertension     Patient Active Problem List   Diagnosis Date Noted  . AKI (acute kidney injury) (HCC) 06/09/2015  . Altered mental status 06/09/2015  . Pyuria 06/09/2015  . Hypertension 06/09/2015  . Acute renal insufficiency 06/09/2015    History reviewed. No pertinent surgical history.     Home Medications    Prior to Admission medications   Medication Sig Start Date End Date Taking? Authorizing Provider  FLUoxetine (PROZAC) 40 MG capsule Take 40 mg by mouth daily. 06/01/15  Yes [provider]  lisinopril (PRINIVIL,ZESTRIL) 20 MG tablet Take 20 mg by mouth daily. 05/20/15  Yes [provider]  Multiple Vitamin (MULTIVITAMIN WITH MINERALS) TABS tablet Take 1 tablet by mouth daily. One A Day   Yes [provider]    Family History History reviewed. No pertinent family history.  Social History Social History  Substance Use Topics  . Smoking status: Never Smoker  . Smokeless tobacco: Not on file  . Alcohol use No     Allergies   Patient has no known allergies.   Review of Systems Review of  Systems  Constitutional: Negative for appetite change and fatigue.  HENT: Negative for congestion, ear discharge and sinus pressure.   Eyes: Negative for discharge.  Respiratory: Negative for cough.   Cardiovascular: Positive for syncope. Negative for chest pain.  Gastrointestinal: Negative for abdominal pain and diarrhea.  Genitourinary: Negative for frequency and hematuria.  Musculoskeletal: Negative for back pain.  Skin: Negative for rash.  Neurological: Positive for dizziness. Negative for seizures and headaches.  Psychiatric/Behavioral: Negative for hallucinations.     Physical Exam Updated Vital Signs BP (!) 142/104   Pulse 73   Temp 98.3 F (36.8 C) (Oral)   Resp 10   SpO2 100%   Physical Exam  Constitutional: He is oriented to person, place, and time. He appears well-developed.  HENT:  Head: Normocephalic.  Eyes: Conjunctivae and EOM are normal. No scleral icterus.  Neck: Neck supple. No thyromegaly present.  Cardiovascular: Normal rate and regular rhythm.  Exam reveals no gallop and no friction rub.   No murmur heard. Pulmonary/Chest: No stridor. He has no wheezes. He has no rales. He exhibits no tenderness.  Abdominal: He exhibits no distension. There is no tenderness. There is no rebound.  Musculoskeletal: Normal range of motion. He exhibits no edema.  Lymphadenopathy:    He has no cervical adenopathy.  Neurological: He is oriented to person, place, and time. He exhibits normal muscle tone. Coordination normal.  Skin: No rash noted. No erythema.  Psychiatric: He has a normal mood and  affect. His behavior is normal.     ED Treatments / Results  Labs (all labs ordered are listed, but only abnormal results are displayed) Labs Reviewed  COMPREHENSIVE METABOLIC PANEL - Abnormal; Notable for the following:       Result Value   Glucose, Bld 107 (*)    Creatinine, Ser 1.29 (*)    ALT 14 (*)    GFR calc non Af Amer 60 (*)    All other components within normal  limits  CBG MONITORING, ED - Abnormal; Notable for the following:    Glucose-Capillary 130 (*)    All other components within normal limits  CBC WITH DIFFERENTIAL/PLATELET  CK  I-STAT TROPOININ, ED    EKG  EKG Interpretation None       Radiology Dg Chest 2 View  Result Date: 08/20/2016 CLINICAL DATA:  Syncope. EXAM: CHEST  2 VIEW COMPARISON:  09/22/2010 FINDINGS: The cardiomediastinal contours are normal. Minimal central bronchial thickening that is chronic. Minimal subsegmental atelectasis in the right midlung. Pulmonary vasculature is normal. No consolidation, pleural effusion, or pneumothorax. No acute osseous abnormalities are seen. IMPRESSION: No active cardiopulmonary disease. Electronically Signed   By: Rubye OaksMelanie  Ehinger M.D.   On: 08/20/2016 21:44   Ct Head Wo Contrast  Result Date: 08/20/2016 CLINICAL DATA:  61 year old male with syncope. History of hypertension. EXAM: CT HEAD WITHOUT CONTRAST TECHNIQUE: Contiguous axial images were obtained from the base of the skull through the vertex without intravenous contrast. COMPARISON:  Head CT dated 06/08/2015 FINDINGS: Brain: No evidence of acute infarction, hemorrhage, hydrocephalus, extra-axial collection or mass lesion/mass effect. Vascular: No hyperdense vessel or unexpected calcification. Skull: Normal. Negative for fracture or focal lesion. Sinuses/Orbits: The visualized paranasal sinuses are clear. There is opacification of the mastoid air cells bilaterally. No air-fluid levels. Other: Irregularity of the nasal bone and nasal septum likely related to old trauma. IMPRESSION: 1. No acute intracranial pathology. 2. Opacification of the mastoid air cells bilaterally. No air-fluid levels. Electronically Signed   By: Elgie CollardArash  Radparvar M.D.   On: 08/20/2016 21:33    Procedures Procedures (including critical care time)  Medications Ordered in ED Medications  sodium chloride 0.9 % bolus 2,000 mL (0 mLs Intravenous Stopped 08/20/16 2202)       Initial Impression / Assessment and Plan / ED Course  I have reviewed the triage vital signs and the nursing notes.  Pertinent labs & imaging results that were available during my care of the patient were reviewed by me and considered in my medical decision making (see chart for details).      Final Clinical Impressions(s) / ED Diagnoses   Final diagnoses:  Heat syncope, initial encounter  Labs unremarkable CT scan normal patient had syncopal episode from his exhaustion. He will follow-up with his PCP  New Prescriptions New Prescriptions   No medications on file     Bethann BerkshireZammit, Jaimie Redditt, MD 08/20/16 2258

## 2016-08-20 NOTE — Discharge Instructions (Signed)
Drink plenty of fluids and follow-up with your doctor this week for recheck °

## 2016-12-16 ENCOUNTER — Emergency Department (HOSPITAL_COMMUNITY): Payer: Medicare Other

## 2016-12-16 ENCOUNTER — Observation Stay (HOSPITAL_COMMUNITY)
Admission: EM | Admit: 2016-12-16 | Discharge: 2016-12-18 | Disposition: A | Discharge status: Home / Self Care | Payer: Medicare Other | Attending: Internal Medicine | Admitting: Internal Medicine

## 2016-12-16 ENCOUNTER — Encounter (HOSPITAL_COMMUNITY): Payer: Self-pay | Admitting: *Deleted

## 2016-12-16 DIAGNOSIS — R410 Disorientation, unspecified: Secondary | ICD-10-CM

## 2016-12-16 DIAGNOSIS — N179 Acute kidney failure, unspecified: Secondary | ICD-10-CM | POA: Insufficient documentation

## 2016-12-16 DIAGNOSIS — R262 Difficulty in walking, not elsewhere classified: Secondary | ICD-10-CM | POA: Diagnosis not present

## 2016-12-16 DIAGNOSIS — I129 Hypertensive chronic kidney disease with stage 1 through stage 4 chronic kidney disease, or unspecified chronic kidney disease: Secondary | ICD-10-CM | POA: Insufficient documentation

## 2016-12-16 DIAGNOSIS — R001 Bradycardia, unspecified: Secondary | ICD-10-CM | POA: Diagnosis not present

## 2016-12-16 DIAGNOSIS — F419 Anxiety disorder, unspecified: Secondary | ICD-10-CM | POA: Insufficient documentation

## 2016-12-16 DIAGNOSIS — N183 Chronic kidney disease, stage 3 (moderate): Secondary | ICD-10-CM | POA: Diagnosis not present

## 2016-12-16 DIAGNOSIS — F1290 Cannabis use, unspecified, uncomplicated: Secondary | ICD-10-CM | POA: Diagnosis not present

## 2016-12-16 DIAGNOSIS — R55 Syncope and collapse: Principal | ICD-10-CM | POA: Diagnosis present

## 2016-12-16 DIAGNOSIS — E860 Dehydration: Secondary | ICD-10-CM | POA: Diagnosis present

## 2016-12-16 DIAGNOSIS — R531 Weakness: Secondary | ICD-10-CM | POA: Insufficient documentation

## 2016-12-16 DIAGNOSIS — I071 Rheumatic tricuspid insufficiency: Secondary | ICD-10-CM | POA: Diagnosis not present

## 2016-12-16 DIAGNOSIS — G9340 Encephalopathy, unspecified: Secondary | ICD-10-CM | POA: Diagnosis present

## 2016-12-16 DIAGNOSIS — Z79899 Other long term (current) drug therapy: Secondary | ICD-10-CM | POA: Insufficient documentation

## 2016-12-16 DIAGNOSIS — I10 Essential (primary) hypertension: Secondary | ICD-10-CM | POA: Diagnosis present

## 2016-12-16 LAB — COMPREHENSIVE METABOLIC PANEL
ALBUMIN: 3.6 g/dL (ref 3.5–5.0)
ALK PHOS: 46 U/L (ref 38–126)
ALT: 13 U/L — AB (ref 17–63)
AST: 21 U/L (ref 15–41)
Anion gap: 7 (ref 5–15)
BUN: 19 mg/dL (ref 6–20)
CHLORIDE: 108 mmol/L (ref 101–111)
CO2: 21 mmol/L — AB (ref 22–32)
CREATININE: 1.51 mg/dL — AB (ref 0.61–1.24)
Calcium: 8.8 mg/dL — ABNORMAL LOW (ref 8.9–10.3)
GFR calc non Af Amer: 49 mL/min — ABNORMAL LOW (ref >60)
GFR, EST AFRICAN AMERICAN: 57 mL/min — AB (ref >60)
GLUCOSE: 133 mg/dL — AB (ref 65–99)
Potassium: 3.9 mmol/L (ref 3.5–5.1)
SODIUM: 136 mmol/L (ref 135–145)
Total Bilirubin: 0.7 mg/dL (ref 0.3–1.2)
Total Protein: 6.2 g/dL — ABNORMAL LOW (ref 6.5–8.1)

## 2016-12-16 LAB — CBC WITH DIFFERENTIAL/PLATELET
BASOS ABS: 0 10*3/uL (ref 0.0–0.1)
BASOS PCT: 0 %
Eosinophils Absolute: 0.1 10*3/uL (ref 0.0–0.7)
Eosinophils Relative: 2 %
HEMATOCRIT: 39.2 % (ref 39.0–52.0)
HEMOGLOBIN: 12.7 g/dL — AB (ref 13.0–17.0)
LYMPHS PCT: 36 %
Lymphs Abs: 2.9 10*3/uL (ref 0.7–4.0)
MCH: 29.1 pg (ref 26.0–34.0)
MCHC: 32.4 g/dL (ref 30.0–36.0)
MCV: 89.7 fL (ref 78.0–100.0)
MONO ABS: 0.4 10*3/uL (ref 0.1–1.0)
Monocytes Relative: 5 %
NEUTROS ABS: 4.7 10*3/uL (ref 1.7–7.7)
NEUTROS PCT: 57 %
Platelets: 201 10*3/uL (ref 150–400)
RBC: 4.37 MIL/uL (ref 4.22–5.81)
RDW: 15.5 % (ref 11.5–15.5)
WBC: 8.2 10*3/uL (ref 4.0–10.5)

## 2016-12-16 LAB — I-STAT CG4 LACTIC ACID, ED: LACTIC ACID, VENOUS: 2.82 mmol/L — AB (ref 0.5–1.9)

## 2016-12-16 LAB — CBG MONITORING, ED: Glucose-Capillary: 98 mg/dL (ref 65–99)

## 2016-12-16 LAB — PROTIME-INR
INR: 1.19
Prothrombin Time: 15 seconds (ref 11.4–15.2)

## 2016-12-16 MED ORDER — SODIUM CHLORIDE 0.9 % IV SOLN
INTRAVENOUS | Status: DC
  Administered 2016-12-16 – 2016-12-18 (×3): via INTRAVENOUS

## 2016-12-16 MED ORDER — SODIUM CHLORIDE 0.9 % IV BOLUS (SEPSIS)
1000.0000 mL | Freq: Once | INTRAVENOUS | Status: AC
  Administered 2016-12-16: 1000 mL via INTRAVENOUS

## 2016-12-16 NOTE — ED Provider Notes (Signed)
[facility] Provider Note   CSN: [id number] Arrival date & time: 12/16/16  2026     History   Chief Complaint Chief Complaint  Patient presents with  . Altered Mental Status    HPI Roy Chavez is a 61 y.o. male.  HPI Patient presents via EMS after calling for weakness, confusion, syncope. Patient was designated as a code STEMI, though he does not have any chest pain. Per report the patient was sitting with friends, became diaphoretic, had an episode of syncope. The patient is oriented to self, roughly to place, but is minimally interactive, listless on arrival. EMS reports that this was persistent throughot transport, with hypotension as well. Patient cannot specify any pertinent medical problems, no recent medication changes, or other relevant changes.   Past Medical History:  Diagnosis Date  . Anxiety   . Hypertension     Patient Active Problem List   Diagnosis Date Noted  . AKI (acute kidney injury) (HCC) 06/09/2015  . Altered mental status 06/09/2015  . Pyuria 06/09/2015  . Hypertension 06/09/2015  . Acute renal insufficiency 06/09/2015    History reviewed. No pertinent surgical history.     Home Medications    Prior to Admission medications   Medication Sig Start Date End Date Taking? Authorizing Provider  FLUoxetine (PROZAC) 40 MG capsule Take 40 mg by mouth daily. 06/01/15   [provider]  lisinopril (PRINIVIL,ZESTRIL) 20 MG tablet Take 20 mg by mouth daily. 05/20/15   [provider]  Multiple Vitamin (MULTIVITAMIN WITH MINERALS) TABS tablet Take 1 tablet by mouth daily. One A Day    [provider]    Family History No family history on file.  Social History Social History  Substance Use Topics  . Smoking status: Never Smoker  . Smokeless tobacco: Never Used  . Alcohol use No     Allergies   Patient has no known allergies.   Review of Systems Review of Systems  Unable to perform ROS: Mental  status change     Physical Exam Updated Vital Signs BP (!) 85/61   Pulse 66   Temp (!) 96.9 F (36.1 C) (Temporal)   Resp 16   SpO2 95%   Physical Exam  Constitutional: He appears well-developed. He appears listless. No distress.  HENT:  Head: Normocephalic and atraumatic.  Eyes: Conjunctivae and EOM are normal.  Cardiovascular: Normal rate and regular rhythm.   Pulmonary/Chest: Effort normal. No stridor. No respiratory distress.  Abdominal: He exhibits no distension.  Musculoskeletal: He exhibits no edema.  Neurological: He appears listless. He displays no tremor. He displays no seizure activity.  Patient slowly responded, answers questions only briefly, succinctly, roughly appropriately, but no spontaneous verbal activity  Skin: Skin is warm and dry.  Psychiatric: He has a normal mood and affect.  Nursing note and vitals reviewed.    ED Treatments / Results  Labs (all labs ordered are listed, but only abnormal results are displayed) Labs Reviewed  CBC WITH DIFFERENTIAL/PLATELET - Abnormal; Notable for the following:       Result Value   Hemoglobin 12.7 (*)    All other components within normal limits  COMPREHENSIVE METABOLIC PANEL - Abnormal; Notable for the following:    CO2 21 (*)    Glucose, Bld 133 (*)    Creatinine, Ser 1.51 (*)    Calcium 8.8 (*)    Total Protein 6.2 (*)    ALT 13 (*)    GFR calc non Af Amer 49 (*)  GFR calc Af Amer 57 (*)    All other components within normal limits  I-STAT CG4 LACTIC ACID, ED - Abnormal; Notable for the following:    Lactic Acid, Venous 2.82 (*)    All other components within normal limits  URINE CULTURE  PROTIME-INR  URINALYSIS, ROUTINE W REFLEX MICROSCOPIC  URINALYSIS, COMPLETE (UACMP) WITH MICROSCOPIC  CBG MONITORING, ED    EKG  EKG Interpretation  Date/Time:  Saturday December 16 2016 20:33:08 EDT Ventricular Rate:  71 PR Interval:    QRS Duration: 84 QT Interval:  397 QTC Calculation: 432 R  Axis:   68 Text Interpretation:  Sinus arrhythmia T wave abnormality Artifact Abnormal ekg Confirmed by Gerhard Munch [phone]) on 12/16/2016 8:56:18 PM       Radiology Ct Head Wo Contrast  Result Date: 12/16/2016 CLINICAL DATA:  61 y/o M; syncopal episode and intermittent headache. EXAM: CT HEAD WITHOUT CONTRAST TECHNIQUE: Contiguous axial images were obtained from the base of the skull through the vertex without intravenous contrast. COMPARISON:  08/20/2016 CT of the head FINDINGS: Brain: No evidence of acute infarction, hemorrhage, hydrocephalus, extra-axial collection or mass lesion/mass effect. Few stable stable foci of hypoattenuation in subcortical white matter likely represent mild chronic microvascular ischemic changes. Vascular: No hyperdense vessel or unexpected calcification. Skull: Normal. Negative for fracture or focal lesion. Sinuses/Orbits: Stable left-greater-than-right mastoid opacification. Opacification of left middle ear cavity. Mild right maxillary sinus mucosal thickening. Normal orbits. Other: None. IMPRESSION: 1. No acute intracranial abnormality. 2. Stable mild chronic microvascular ischemic changes of the brain. 3. Stable left-greater-than-right mastoid opacification. Electronically Signed   By: Mitzi Hansen M.D.   On: 12/16/2016 22:14   Dg Chest Port 1 View  Result Date: 12/16/2016 CLINICAL DATA:  61 year old male code STEMI.  Hypotensive. EXAM: PORTABLE CHEST 1 VIEW COMPARISON:  08/20/2016 and earlier. FINDINGS: Portable AP semi upright view at 2041 hours. The patient is rotated to the right. Grossly normal mediastinal contours. Visualized tracheal air column is within normal limits. No pneumothorax, pleural effusion or confluent pulmonary opacity. Basilar predominant pulmonary vascular congestion. Upper lobe vascularity appears fairly normal. IMPRESSION: Rotated view of the chest. Basilar predominant pulmonary vascular congestion raising the possibility of  mild or developing interstitial edema. Electronically Signed   By: Odessa Fleming M.D.   On: 12/16/2016 20:56    Procedures Procedures (including critical care time)  Medications Ordered in ED Medications  sodium chloride 0.9 % bolus 1,000 mL (1,000 mLs Intravenous New Bag/Given 12/16/16 2053)    And  0.9 %  sodium chloride infusion ( Intravenous New Bag/Given 12/16/16 2054)     Initial Impression / Assessment and Plan / ED Course  I have reviewed the triage vital signs and the nursing notes.  Pertinent labs & imaging results that were available during my care of the patient were reviewed by me and considered in my medical decision making (see chart for details).    11:47 PM Patient's blood pressure has improved somewhat, 100/65. He is now joined by his daughter who corroborates that the patient had 2 episodes of syncope e dehydration.arlier today. Reported the patient was in usual state of health, when she witnessed him to lose consciousness, awakened, and lose consciousness.  This patient presents after episode of syncope, with altered mental status, hypotension. Patient has a history of hypertension,but has hypotension on arrival, and with diaphoresis, there some suspicion for arrhythmogenic episodes, less evidence for infection, and no obvious source of this. Patient received fluid resuscitation here, and after  evaluation by our cardiology colleagues, the code STEMI was canceled. However, given concern for recurrent syncope,mild hypotension, the patient was admitted for further evaluation and management.  Final Clinical Impressions(s) / ED Diagnoses  Syncope   Gerhard Munch, MD 12/16/16 2350

## 2016-12-16 NOTE — ED Triage Notes (Signed)
Pt to ED by EMS initially activated as a Code STEMI that was cancelled on arrival. Pt was sitting with friends, became very diaphoretic, and had a syncopal episode. EMS reported faint radial pulses on arrival with AMS enroute. Pt A&Ox3 with CBG of 98 on arrival. Noted to be hypotensive. Denies chest pain, has intermittent headache

## 2016-12-17 ENCOUNTER — Observation Stay (HOSPITAL_BASED_OUTPATIENT_CLINIC_OR_DEPARTMENT_OTHER)
Admit: 2016-12-17 | Discharge: 2016-12-17 | Disposition: A | Discharge status: Home / Self Care | Payer: Medicare Other | Attending: Internal Medicine | Admitting: Internal Medicine

## 2016-12-17 DIAGNOSIS — G9340 Encephalopathy, unspecified: Secondary | ICD-10-CM | POA: Diagnosis present

## 2016-12-17 DIAGNOSIS — I361 Nonrheumatic tricuspid (valve) insufficiency: Secondary | ICD-10-CM

## 2016-12-17 DIAGNOSIS — R55 Syncope and collapse: Secondary | ICD-10-CM | POA: Insufficient documentation

## 2016-12-17 DIAGNOSIS — N183 Chronic kidney disease, stage 3 unspecified: Secondary | ICD-10-CM | POA: Insufficient documentation

## 2016-12-17 DIAGNOSIS — F1910 Other psychoactive substance abuse, uncomplicated: Secondary | ICD-10-CM | POA: Insufficient documentation

## 2016-12-17 LAB — URINALYSIS, COMPLETE (UACMP) WITH MICROSCOPIC
Bilirubin Urine: NEGATIVE
GLUCOSE, UA: NEGATIVE mg/dL
Hgb urine dipstick: NEGATIVE
KETONES UR: NEGATIVE mg/dL
LEUKOCYTES UA: NEGATIVE
NITRITE: NEGATIVE
PH: 6 (ref 5.0–8.0)
Protein, ur: NEGATIVE mg/dL
RBC / HPF: NONE SEEN RBC/hpf (ref 0–5)
SPECIFIC GRAVITY, URINE: 1.02 (ref 1.005–1.030)

## 2016-12-17 LAB — RAPID URINE DRUG SCREEN, HOSP PERFORMED
AMPHETAMINES: NOT DETECTED
BARBITURATES: NOT DETECTED
Benzodiazepines: NOT DETECTED
COCAINE: NOT DETECTED
Opiates: NOT DETECTED
TETRAHYDROCANNABINOL: POSITIVE — AB

## 2016-12-17 LAB — TSH: TSH: 0.409 u[IU]/mL (ref 0.350–4.500)

## 2016-12-17 LAB — CREATININE, SERUM
Creatinine, Ser: 1.2 mg/dL (ref 0.61–1.24)
GFR calc Af Amer: >60 mL/min (ref >60)
GFR calc non Af Amer: >60 mL/min (ref >60)

## 2016-12-17 LAB — TROPONIN I: Troponin I: <0.03 ng/mL (ref <0.03)

## 2016-12-17 LAB — CK: Total CK: 245 U/L (ref 49–397)

## 2016-12-17 LAB — CBC
HEMATOCRIT: 38.2 % — AB (ref 39.0–52.0)
Hemoglobin: 12.2 g/dL — ABNORMAL LOW (ref 13.0–17.0)
MCH: 28.6 pg (ref 26.0–34.0)
MCHC: 31.9 g/dL (ref 30.0–36.0)
MCV: 89.7 fL (ref 78.0–100.0)
Platelets: 221 10*3/uL (ref 150–400)
RBC: 4.26 MIL/uL (ref 4.22–5.81)
RDW: 15.5 % (ref 11.5–15.5)
WBC: 7.8 10*3/uL (ref 4.0–10.5)

## 2016-12-17 LAB — LACTIC ACID, PLASMA
LACTIC ACID, VENOUS: 1 mmol/L (ref 0.5–1.9)
Lactic Acid, Venous: 1.5 mmol/L (ref 0.5–1.9)

## 2016-12-17 LAB — ETHANOL: Alcohol, Ethyl (B): <10 mg/dL (ref <10)

## 2016-12-17 LAB — ECHOCARDIOGRAM COMPLETE

## 2016-12-17 MED ORDER — ASPIRIN EC 81 MG PO TBEC
81.0000 mg | DELAYED_RELEASE_TABLET | Freq: Every day | ORAL | Status: DC
  Administered 2016-12-17: 81 mg via ORAL
  Filled 2016-12-17 (×2): qty 1

## 2016-12-17 MED ORDER — HEPARIN SODIUM (PORCINE) 5000 UNIT/ML IJ SOLN
5000.0000 [IU] | Freq: Three times a day (TID) | INTRAMUSCULAR | Status: DC
  Administered 2016-12-17 – 2016-12-18 (×3): 5000 [IU] via SUBCUTANEOUS
  Filled 2016-12-17 (×2): qty 1

## 2016-12-17 MED ORDER — SODIUM CHLORIDE 0.9 % IV SOLN
INTRAVENOUS | Status: AC
  Administered 2016-12-17: 07:00:00 via INTRAVENOUS

## 2016-12-17 MED ORDER — FLUOXETINE HCL 20 MG PO CAPS
40.0000 mg | ORAL_CAPSULE | Freq: Every day | ORAL | Status: DC
  Administered 2016-12-17 – 2016-12-18 (×2): 40 mg via ORAL
  Filled 2016-12-17 (×2): qty 2

## 2016-12-17 NOTE — Progress Notes (Signed)
Roy Chavez is a 61 y.o. male patient admitted from ED awake, alert - oriented  X 4 - no acute distress noted.  VSS - Blood pressure 116/85, pulse (!) 59, temperature 98.5 F (36.9 C), temperature source Oral, resp. rate 16, height  (1.727 m), weight 80.3 kg (177 lb), SpO2 99 %.    IV in place, occlusive dsg intact without redness.  Orientation to room, and floor completed with information packet given to patient/family.  Admission INP armband ID verified with patient/family, and in place.   SR up x 2, fall assessment complete, with patient and family able to verbalize understanding of risk associated with falls, and verbalized understanding to call nsg before up out of bed.  Call light within reach, patient able to voice, and demonstrate understanding.  Skin, clean-dry- intact without evidence of bruising, or skin tears.   No evidence of skin break down noted on exam.     Will cont to eval and treat per MD orders.  Marca Ancona, RN 12/17/2016 4:15 PM

## 2016-12-17 NOTE — ED Notes (Signed)
Echo at bedside will transport pt once Echo complete

## 2016-12-17 NOTE — ED Notes (Signed)
Gave pt sandwich and coke per [employer].

## 2016-12-17 NOTE — ED Notes (Signed)
While obtaining orthostatic vital signs, pt stated he could not stand up, nurse was notified.

## 2016-12-17 NOTE — Plan of Care (Signed)
Needs to be seen   61 year old male with generalized fatigue confusion syncope initially presented this code STEMI seen by cardiology no associated chest pain had an episode of diaphoresis has episode of hypotension. Code STEMI was canceled and patient is being currently admitted for syncope workup  Observation, Tele CE negative Morgen Ritacco  6:22 AM

## 2016-12-17 NOTE — Progress Notes (Signed)
  Echocardiogram 2D Echocardiogram has been performed.  Roy Chavez F 12/17/2016, 1:22 PM

## 2016-12-17 NOTE — H&P (Signed)
[facility] History and Physical  Roy Chavez NWG:[id number] DOB: [birth year] DOA: 12/16/2016  Referring physician:  PCP: Leilani Able, MD   Chief Complaint: Syncope  HPI: Roy Chavez is a 61 y.o. BM PMHx  Anxiety, HTN,CKD stage 2-3, Substance abuse  Presents via EMS after calling for weakness, confusion, syncope. Patient was designated as a code STEMI, though he does not have any chest pain. Per report the patient was sitting with friends, became diaphoretic, had an episode of syncope. The patient is oriented to self, roughly to place, but is minimally interactive, listless on arrival. EMS reports that this was persistent throughot transport, with hypotension as well. Patient cannot specify any pertinent medical problems, no recent medication changes, or other relevant changes. Evaluated by cardiology and code STEMI canceled. Patient states believes was positive LOC for 30 minutes prior to his friend finding him down. Has had previous episodes of positive LOC but never for this length of time. Positive use of marijuana, but no other illegal substance.     Review of Systems:  Constitutional:  No weight loss, night sweats, Fevers, chills, fatigue.  HEENT:  No headaches, Difficulty swallowing,Tooth/dental problems,Sore throat,  No sneezing, itching, ear ache, nasal congestion, post nasal drip,  Cardio-vascular:  No chest pain, Orthopnea, PND, swelling in lower extremities, anasarca, dizziness, palpitations  GI:  No heartburn, indigestion, abdominal pain, nausea, vomiting, diarrhea, change in bowel habits, loss of appetite  Resp:  No shortness of breath with exertion or at rest. No excess mucus, no productive cough, No non-productive cough, No coughing up of blood.No change in color of mucus.No wheezing.No chest wall deformity  Skin:  no rash or lesions.  GU:  no dysuria, change in color of urine, no urgency or frequency. No flank pain.  Musculoskeletal:  No joint  pain or swelling. No decreased range of motion. No back pain.  Psych:  No change in mood or affect. No depression or anxiety. No memory loss.   Past Medical History:  Diagnosis Date  . Anxiety   . Hypertension    History reviewed. No pertinent surgical history. Social History:  reports that he has never smoked. He has never used smokeless tobacco. He reports that he does  drink alcohol, positive marijuana use.  No Known Allergies  FHx; negative heart failure, negative cancer, negative bleeding dyscrasia, negative renal failure.  Prior to Admission medications   Medication Sig Start Date End Date Taking? Authorizing Provider  diphenhydrAMINE (BENADRYL) 25 MG tablet Take 25 mg by mouth daily.   Yes [provider]  FLUoxetine (PROZAC) 40 MG capsule Take 40 mg by mouth daily. 06/01/15  Yes [provider]  lisinopril (PRINIVIL,ZESTRIL) 20 MG tablet Take 20 mg by mouth daily. 05/20/15  Yes [provider]  Multiple Vitamin (MULTIVITAMIN WITH MINERALS) TABS tablet Take 1 tablet by mouth daily. One A Day   Yes [provider]     Consultants:  none   Procedures/Significant Events:  10/13 CT head W contrast:negative acute infarct Echocardiogram pending   I have personally reviewed and interpreted all radiology studies and my findings are as above.   VENTILATOR SETTINGS:    Cultures   Antimicrobials:    Devices    LINES / TUBES:      Continuous Infusions: . sodium chloride Stopped (12/17/16 0630)  . sodium chloride 75 mL/hr at 12/17/16 0630    Physical Exam: Vitals:   12/17/16 0740 12/17/16 0800 12/17/16 0900 12/17/16 0913  BP: 117/86 119/82 128/80 128/80  Pulse:  65 87 66 67  Resp: Temp:      TempSrc:      SpO2: 98% 99% 98% 97%    Wt Readings from Last 3 Encounters:  06/09/15 168 lb (76.2 kg)    General: A/O 4,No acute respiratory distress Eyes: negative scleral hemorrhage, negative anisocoria,  negative icterus ENT: Negative Runny nose, negative gingival bleeding,poor dentation Neck:  Negative scars, masses, torticollis, lymphadenopathy, JVD Lungs: Clear to auscultation bilaterally without wheezes or crackles Cardiovascular: Regular rate and rhythm without murmur gallop or rub normal S1 and S2 Abdomen: negative abdominal pain, nondistended, positive soft, bowel sounds, no rebound, no ascites, no appreciable mass Extremities: No significant cyanosis, clubbing, or edema bilateral lower extremities Skin: Negative rashes, lesions, ulcers Psychiatric:  Negative depression, negative anxiety, negative fatigue, negative mania  Central nervous system:  Cranial nerves II through XII intact, tongue/uvula midline, all extremities muscle strength 5/5, sensation intact throughout,  negative dysarthria, negative expressive aphasia, negative receptive aphasia.        Labs on Admission:  Basic Metabolic Panel:  Recent Labs Lab 12/16/16 2239  NA 136  K 3.9  CL 108  CO2 21*  GLUCOSE 133*  BUN 19  CREATININE 1.51*  CALCIUM 8.8*   Liver Function Tests:  Recent Labs Lab 12/16/16 2239  AST 21  ALT 13*  ALKPHOS 46  BILITOT 0.7  PROT 6.2*  ALBUMIN 3.6   No results for input(s): LIPASE, AMYLASE in the last 168 hours. No results for input(s): AMMONIA in the last 168 hours. CBC:  Recent Labs Lab 12/16/16 2033  WBC 8.2  NEUTROABS 4.7  HGB 12.7*  HCT 39.2  MCV 89.7  PLT 201   Cardiac Enzymes:  Recent Labs Lab 12/17/16 0022 12/17/16 0506  TROPONINI <0.03 <0.03    BNP (last 3 results) No results for input(s): BNP in the last 8760 hours.  ProBNP (last 3 results) No results for input(s): PROBNP in the last 8760 hours.  CBG:  Recent Labs Lab 12/16/16 2032  GLUCAP 98    Radiological Exams on Admission: Ct Head Wo Contrast  Result Date: 12/16/2016 CLINICAL DATA:  61 y/o M; syncopal episode and intermittent headache. EXAM: CT HEAD WITHOUT CONTRAST TECHNIQUE:  Contiguous axial images were obtained from the base of the skull through the vertex without intravenous contrast. COMPARISON:  08/20/2016 CT of the head FINDINGS: Brain: No evidence of acute infarction, hemorrhage, hydrocephalus, extra-axial collection or mass lesion/mass effect. Few stable stable foci of hypoattenuation in subcortical white matter likely represent mild chronic microvascular ischemic changes. Vascular: No hyperdense vessel or unexpected calcification. Skull: Normal. Negative for fracture or focal lesion. Sinuses/Orbits: Stable left-greater-than-right mastoid opacification. Opacification of left middle ear cavity. Mild right maxillary sinus mucosal thickening. Normal orbits. Other: None. IMPRESSION: 1. No acute intracranial abnormality. 2. Stable mild chronic microvascular ischemic changes of the brain. 3. Stable left-greater-than-right mastoid opacification. Electronically Signed   By: Mitzi Hansen M.D.   On: 12/16/2016 22:14   Dg Chest Port 1 View  Result Date: 12/16/2016 CLINICAL DATA:  61 year old male code STEMI.  Hypotensive. EXAM: PORTABLE CHEST 1 VIEW COMPARISON:  08/20/2016 and earlier. FINDINGS: Portable AP semi upright view at 2041 hours. The patient is rotated to the right. Grossly normal mediastinal contours. Visualized tracheal air column is within normal limits. No pneumothorax, pleural effusion or confluent pulmonary opacity. Basilar predominant pulmonary vascular congestion. Upper lobe vascularity appears fairly normal. IMPRESSION: Rotated view of the chest. Basilar predominant pulmonary vascular congestion raising  the possibility of mild or developing interstitial edema. Electronically Signed   By: Odessa Fleming M.D.   On: 12/16/2016 20:56    EKG: Independently reviewed. pending  Assessment/Plan Active Problems:   Hypertension   Syncope   Dehydration   Acute encephalopathy    Syncope and collapse -unknown cause. CT head negative; awaiting EKG and  echocardiogram. Awaiting orthostatic vitals. Awaiting toxicology screen. -orthostatic vitals pending -UDS and Ethanol level pending -Echocardiogram pending -CT head negative  Essential hypertension -Hold home BP medication  Substance abuse -Patient admits to using marijuana -counseled on sequela of continuing to use given his multiple episodes of LOC.  CKD stage II-III -strict in and out -daily weight -Monitor closely  Anxiety -Prozac 40 mg daily   Code Status: full (DVT Prophylaxis:Subcutaneous heparin Family Communication: none  Disposition Plan: next 24 hours   Data Reviewed: Care during the described time interval was provided by me .  I have reviewed this patient's available data, including medical history, events of note, physical examination, and all test results as part of my evaluation.   Time spent: 60 min  Denver Bentson, Roselind Messier [facility] Pager [phone]

## 2016-12-18 DIAGNOSIS — E860 Dehydration: Secondary | ICD-10-CM

## 2016-12-18 DIAGNOSIS — R55 Syncope and collapse: Secondary | ICD-10-CM | POA: Diagnosis not present

## 2016-12-18 DIAGNOSIS — I10 Essential (primary) hypertension: Secondary | ICD-10-CM

## 2016-12-18 LAB — HIV ANTIBODY (ROUTINE TESTING W REFLEX): HIV Screen 4th Generation wRfx: NONREACTIVE

## 2016-12-18 LAB — BASIC METABOLIC PANEL
Anion gap: 8 (ref 5–15)
BUN: 11 mg/dL (ref 6–20)
CALCIUM: 8.4 mg/dL — AB (ref 8.9–10.3)
CHLORIDE: 110 mmol/L (ref 101–111)
CO2: 21 mmol/L — AB (ref 22–32)
CREATININE: 1.03 mg/dL (ref 0.61–1.24)
GFR calc Af Amer: >60 mL/min (ref >60)
GFR calc non Af Amer: >60 mL/min (ref >60)
GLUCOSE: 86 mg/dL (ref 65–99)
Potassium: 4 mmol/L (ref 3.5–5.1)
Sodium: 139 mmol/L (ref 135–145)

## 2016-12-18 LAB — URINE CULTURE: Special Requests: NORMAL

## 2016-12-18 MED ORDER — AMLODIPINE BESYLATE 5 MG PO TABS: 5.0000 mg | ORAL_TABLET | Freq: Every day | ORAL | 0 refills | Status: DC

## 2016-12-18 NOTE — Discharge Summary (Signed)
Physician Discharge Summary  Roy Chavez ZOX:[id number] DOB: June 07, 1955 DOA: 12/16/2016  PCP: Leilani Able, MD  Admit date: 12/16/2016 Discharge date: 12/18/2016   Recommendations for Outpatient Follow-Up:   1. Episodes seem to be related to dehydration/AKI-- do not seem frequent enough that a 30 day event monitor would catch- tele here with sinus brady 2. Marijuana cessation   Discharge Diagnosis:   Active Problems:   Hypertension   Syncope   Dehydration   Acute encephalopathy   Discharge disposition:  Home  Discharge Condition: Improved.  Diet recommendation: Low sodium, heart healthy  Wound care: None.   History of Present Illness:   Roy Chavez is a 61 y.o. BM PMHx  Anxiety, HTN,CKD stage 2-3, Substance abuse  Presents via EMS after calling for weakness, confusion, syncope. Patient was designated as a code STEMI, though he does not have any chest pain. Per report the patient was sitting with friends, became diaphoretic, had an episode of syncope. The patient is oriented to self, roughly to place, but is minimally interactive, listless on arrival. EMS reports that this was persistent throughot transport, with hypotension as well. Patient cannot specify any pertinent medical problems, no recent medication changes, or other relevant changes. Evaluated by cardiology and code STEMI canceled. Patient states believes was positive LOC for 30 minutes prior to his friend finding him down. Has had previous episodes of positive LOC but never for this length of time. Positive use of marijuana, but no other illegal substance.   Hospital Course by Problem:   Syncope and collapse -unknown cause. CT head negative -suspect due to dehydration -happened before in 2017  Essential hypertension -modify BP meds  Substance abuse -Patient admits to using marijuana -counseled on sequela of continuing to use given his multiple episodes of LOC.  AKI -improved with  IVF -outpatient follow up  Anxiety -Prozac 40 mg daily   Medical Consultants:    None.   Discharge Exam:   Vitals:   12/18/16 0608 12/18/16 0609  BP:  (!) 136/94  Pulse:  (!) 56  Resp: 20 20  Temp:    SpO2:  98%   Vitals:   12/18/16 0605 12/18/16 0607 12/18/16 0608 12/18/16 0609  BP: 128/80 (!) 142/96  (!) 136/94  Pulse: (!) 52 (!) 54  (!) 56  Resp: Temp:      TempSrc:      SpO2: 99% 100%  98%  Weight:      Height:        Gen:  NAD    The results of significant diagnostics from this hospitalization (including imaging, microbiology, ancillary and laboratory) are listed below for reference.     Procedures and Diagnostic Studies:   No results found.   Labs:   Basic Metabolic Panel:  Recent Labs Lab 12/16/16 2239 12/17/16 1312 12/18/16 0341  NA 136  --  139  K 3.9  --  4.0  CL 108  --  110  CO2 21*  --  21*  GLUCOSE 133*  --  86  BUN 19  --  11  CREATININE 1.51* 1.20 1.03  CALCIUM 8.8*  --  8.4*   GFR Estimated Creatinine Clearance: 74.7 mL/min (by C-G formula based on SCr of 1.03 mg/dL). Liver Function Tests:  Recent Labs Lab 12/16/16 2239  AST 21  ALT 13*  ALKPHOS 46  BILITOT 0.7  PROT 6.2*  ALBUMIN 3.6   No results for input(s): LIPASE, AMYLASE in the last 168 hours.  No results for input(s): AMMONIA in the last 168 hours. Coagulation profile  Recent Labs Lab 12/16/16 2033  INR 1.19    CBC:  Recent Labs Lab 12/16/16 2033 12/17/16 1312  WBC 8.2 7.8  NEUTROABS 4.7  --   HGB 12.7* 12.2*  HCT 39.2 38.2*  MCV 89.7 89.7  PLT 201 221   Cardiac Enzymes:  Recent Labs Lab 12/17/16 0022 12/17/16 0506 12/17/16 1312  CKTOTAL  --   --  245  TROPONINI <0.03 <0.03 <0.03   BNP: Invalid input(s): POCBNP CBG:  Recent Labs Lab 12/16/16 2032  GLUCAP 98   D-Dimer No results for input(s): DDIMER in the last 72 hours. Hgb A1c No results for input(s): HGBA1C in the last 72 hours. Lipid Profile No results  for input(s): CHOL, HDL, LDLCALC, TRIG, CHOLHDL, LDLDIRECT in the last 72 hours. Thyroid function studies  Recent Labs  12/17/16 1312  TSH 0.409   Anemia work up No results for input(s): VITAMINB12, FOLATE, FERRITIN, TIBC, IRON, RETICCTPCT in the last 72 hours. Microbiology Recent Results (from the past 240 hour(s))  Urine culture     Status: Abnormal   Collection Time: 12/16/16  6:59 AM  Result Value Ref Range Status   Specimen Description URINE, RANDOM  Final   Special Requests unknown Normal  Final   Culture MULTIPLE SPECIES PRESENT, SUGGEST RECOLLECTION (A)  Final   Report Status 12/18/2016 FINAL  Final     Discharge Instructions:   Discharge Instructions    Diet - low sodium heart healthy    Complete by:  As directed    Discharge instructions    Complete by:  As directed    Stay hydrated Marijuana cessation   Increase activity slowly    Complete by:  As directed      Allergies as of 12/18/2016   No Known Allergies     Medication List    STOP taking these medications   diphenhydrAMINE 25 MG tablet Commonly known as:  BENADRYL   lisinopril 20 MG tablet Commonly known as:  PRINIVIL,ZESTRIL     TAKE these medications   amLODipine 5 MG tablet Commonly known as:  NORVASC Take 1 tablet (5 mg total) by mouth daily.   FLUoxetine 40 MG capsule Commonly known as:  PROZAC Take 40 mg by mouth daily.   multivitamin with minerals Tabs tablet Take 1 tablet by mouth daily. One A Day      Follow-up Information    Leilani Able, MD Follow up in 1 week(s).   Specialty:  Family Medicine Contact information: [address] [place] [address] [phone]            Time coordinating discharge: 25 min  Signed:  Joseph Art   [facility] 12/18/2016, 2:34 PM

## 2016-12-18 NOTE — Care Management Obs Status (Signed)
MEDICARE OBSERVATION STATUS NOTIFICATION   Patient Details  Name: Roy Chavez MRN: [id number] Date of Birth: [birth year]   Medicare Observation Status Notification Given:  Yes    Durenda Guthrie, RN 12/18/2016, 2:28 PM

## 2016-12-18 NOTE — Progress Notes (Signed)
Pt given discharge instructions, prescriptions, and care notes. Pt verbalized understanding AEB no further questions or concerns at this time. IV was discontinued, no redness, pain, or swelling noted at this time. Telemetry discontinued and Centralized Telemetry was notified. Pt to leave the floor via wheelchair with staff in stable condition. 

## 2016-12-18 NOTE — Discharge Instructions (Signed)
Follow with Roy Able, MD in 5-7 days  Please get a complete blood count and chemistry panel checked by your Primary MD at your next visit, and again as instructed by your Primary MD. Please get your medications reviewed and adjusted by your Primary MD.  Please request your Primary MD to go over all Hospital Tests and Procedure/Radiological results at the follow up, please get all Hospital records sent to your Prim MD by signing hospital release before you go home.  If you had Pneumonia of Lung problems at the Hospital: Please get a 2 view Chest X ray done in 6-8 weeks after hospital discharge or sooner if instructed by your Primary MD.  If you have Congestive Heart Failure: Please call your Cardiologist or Primary MD anytime you have any of the following symptoms:  1) 3 pound weight gain in 24 hours or 5 pounds in 1 week  2) shortness of breath, with or without a dry hacking cough  3) swelling in the hands, feet or stomach  4) if you have to sleep on extra pillows at night in order to breathe  Follow cardiac low salt diet and 1.5 lit/day fluid restriction.  If you have diabetes Accuchecks 4 times/day, Once in AM empty stomach and then before each meal. Log in all results and show them to your primary doctor at your next visit. If any glucose reading is under 80 or above 300 call your primary MD immediately.  If you have Seizure/Convulsions/Epilepsy: Please do not drive, operate heavy machinery, participate in activities at heights or participate in high speed sports until you have seen by Primary MD or a Neurologist and advised to do so again.  If you had Gastrointestinal Bleeding: Please ask your Primary MD to check a complete blood count within one week of discharge or at your next visit. Your endoscopic/colonoscopic biopsies that are pending at the time of discharge, will also need to followed by your Primary MD.  Get Medicines reviewed and adjusted. Please take all your  medications with you for your next visit with your Primary MD  Please request your Primary MD to go over all hospital tests and procedure/radiological results at the follow up, please ask your Primary MD to get all Hospital records sent to his/her office.  If you experience worsening of your admission symptoms, develop shortness of breath, life threatening emergency, suicidal or homicidal thoughts you must seek medical attention immediately by calling 911 or calling your MD immediately  if symptoms less severe.  You must read complete instructions/literature along with all the possible adverse reactions/side effects for all the Medicines you take and that have been prescribed to you. Take any new Medicines after you have completely understood and accpet all the possible adverse reactions/side effects.   Do not drive or operate heavy machinery when taking Pain medications.   Do not take more than prescribed Pain, Sleep and Anxiety Medications  Special Instructions: If you have smoked or chewed Tobacco  in the last 2 yrs please stop smoking, stop any regular Alcohol  and or any Recreational drug use.  Wear Seat belts while driving.  Please note You were cared for by a hospitalist during your hospital stay. If you have any questions about your discharge medications or the care you received while you were in the hospital after you are discharged, you can call the unit and asked to speak with the hospitalist on call if the hospitalist that took care of you is not available. Once  you are discharged, your primary care physician will handle any further medical issues. Please note that NO REFILLS for any discharge medications will be authorized once you are discharged, as it is imperative that you return to your primary care physician (or establish a relationship with a primary care physician if you do not have one) for your aftercare needs so that they can reassess your need for medications and monitor your  lab values.  You can reach the hospitalist office at phone [phone] or fax [phone]   If you do not have a primary care physician, you can call [phone] for a physician referral.  Activity: As tolerated with Full fall precautions use walker/cane & assistance as needed

## 2016-12-18 NOTE — Evaluation (Signed)
Physical Therapy Evaluation/ Discharge Patient Details Name: Roy Chavez MRN: [id number] DOB: Nov 23, 1955 Today's Date: 12/18/2016   History of Present Illness  61 yo admitted with syncope. PMHx: anxiety, HTN  Clinical Impression  Pt very pleasant, joking and giving this therapist a hard time throughout. Pt able to perform all mobility, gait, transfers and activity without dizziness, LOB, blurry vision or symptoms of vestibular dysfunction. Pt at baseline functional level without further therapy needs at this time, pt aware and agreeable. Will sign off and recommend daily ambulation acutely.     Follow Up Recommendations No PT follow up    Equipment Recommendations  None recommended by PT    Recommendations for Other Services       Precautions / Restrictions Precautions Precautions: None      Mobility  Bed Mobility Overal bed mobility: Independent                Transfers Overall transfer level: Independent                  Ambulation/Gait Ambulation/Gait assistance: Independent Ambulation Distance (Feet): 500 Feet Assistive device: None Gait Pattern/deviations: WFL(Within Functional Limits)   Gait velocity interpretation: at or above normal speed for age/gender General Gait Details: pt able to maintain balance and good speed despite directional changes and head turns  Stairs Stairs: Yes Stairs assistance: Independent Stair Management: No rails;Alternating pattern;Forwards Number of Stairs: 11    Wheelchair Mobility    Modified Rankin (Stroke Patients Only)       Balance Overall balance assessment: No apparent balance deficits (not formally assessed)                                           Pertinent Vitals/Pain Pain Assessment: No/denies pain    Home Living Family/patient expects to be discharged to:: Private residence Living Arrangements: Alone   Type of Home: Apartment Home Access: Stairs to enter    [employer] of Steps: 8 Home Layout: One level Home Equipment: None      Prior Function Level of Independence: Independent               Hand Dominance        Extremity/Trunk Assessment   Upper Extremity Assessment Upper Extremity Assessment: Overall WFL for tasks assessed    Lower Extremity Assessment Lower Extremity Assessment: Overall WFL for tasks assessed    Cervical / Trunk Assessment Cervical / Trunk Assessment: Normal  Communication   Communication: No difficulties  Cognition Arousal/Alertness: Awake/alert Behavior During Therapy: WFL for tasks assessed/performed Overall Cognitive Status: Within Functional Limits for tasks assessed                                        General Comments      Exercises     Assessment/Plan    PT Assessment Patent does not need any further PT services  PT Problem List         PT Treatment Interventions      PT Goals (Current goals can be found in the Care Plan section)  Acute Rehab PT Goals PT Goal Formulation: All assessment and education complete, DC therapy    Frequency     Barriers to discharge        Co-evaluation  AM-PAC PT "6 Clicks" Daily Activity  Outcome Measure Difficulty turning over in bed (including adjusting bedclothes, sheets and blankets)?: None Difficulty moving from lying on back to sitting on the side of the bed? : None Difficulty sitting down on and standing up from a chair with arms (e.g., wheelchair, bedside commode, etc,.)?: None Help needed moving to and from a bed to chair (including a wheelchair)?: None Help needed walking in hospital room?: None Help needed climbing 3-5 steps with a railing? : None 6 Click Score: 24    End of Session Equipment Utilized During Treatment: Gait belt Activity Tolerance: Patient tolerated treatment well Patient left: in chair;with call bell/phone within reach Nurse Communication: Mobility  status PT Visit Diagnosis: Difficulty in walking, not elsewhere classified (R26.2)    Time: 1345-1400 PT Time Calculation (min) (ACUTE ONLY): 15 min   Charges:   PT Evaluation $PT Eval Low Complexity: 1 Low     PT G Codes:   PT G-Codes **NOT FOR INPATIENT CLASS** Functional Assessment Tool Used: AM-PAC 6 Clicks Basic Mobility Functional Limitation: Mobility: Walking and moving around Mobility: Walking and Moving Around Current Status (Z6109): 0 percent impaired, limited or restricted Mobility: Walking and Moving Around Goal Status (U0454): 0 percent impaired, limited or restricted Mobility: Walking and Moving Around Discharge Status (U9811): 0 percent impaired, limited or restricted    Delaney Meigs, PT [phone]   Enedina Finner Dorismar Chay 12/18/2016, 2:14 PM

## 2017-10-06 IMAGING — CT CT HEAD W/O CM
3 series · 16 of 47 positions shown, 19 images · non-contrast
Comparison: Head CT dated 06/08/2015

CLINICAL DATA: 60-year-old male with syncope. History of
hypertension.

EXAM:
CT HEAD WITHOUT CONTRAST
TECHNIQUE: Contiguous axial images were obtained from the base of the skull
through the vertex without intravenous contrast.

[Series 3: head 5.0 h30s · axial · 0.42mm/px · z∈[-109,+26]mm · 10 of 33 slices shown, 13 images]
[im 3/33  brain]
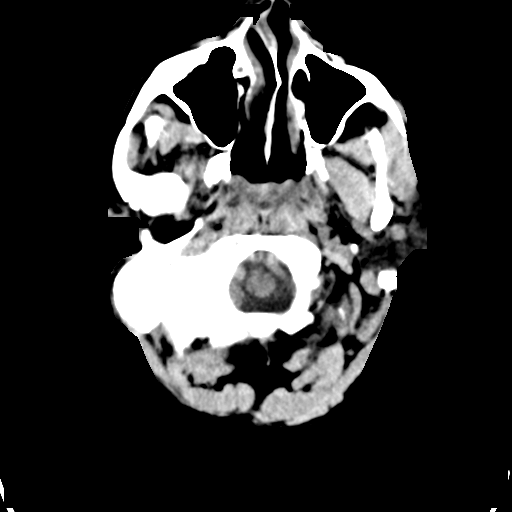
[im 3/33  bone]
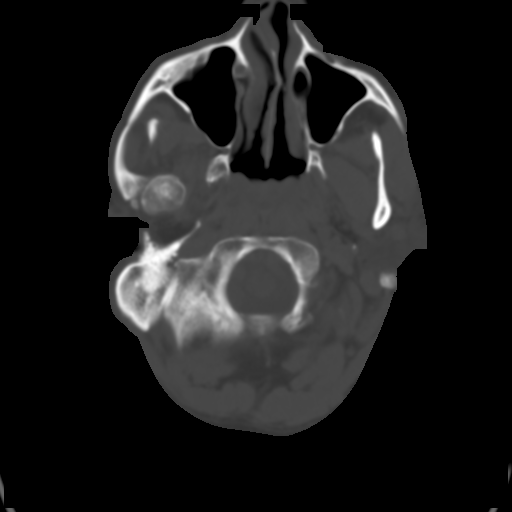
[im 6/33  brain]
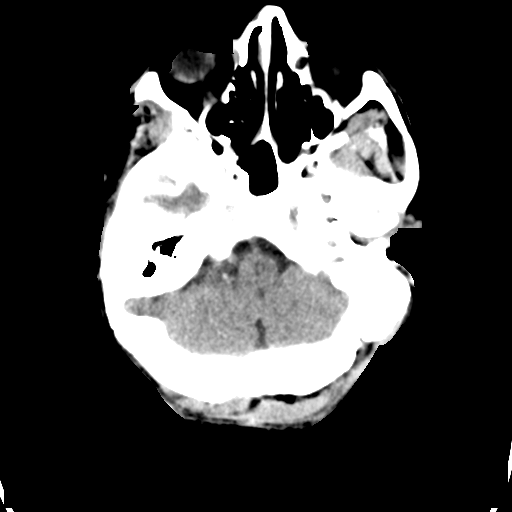
[im 9/33  brain]
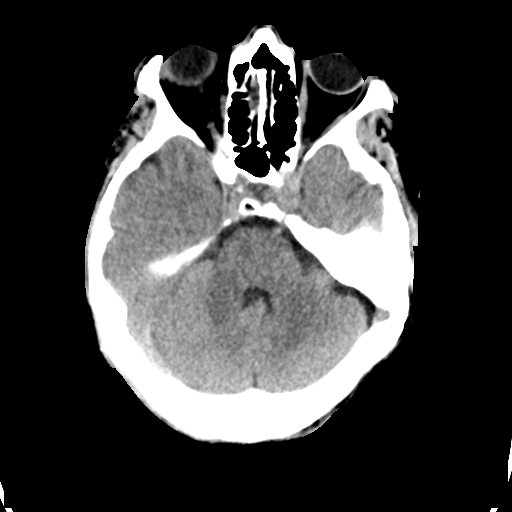
[im 12/33  brain]
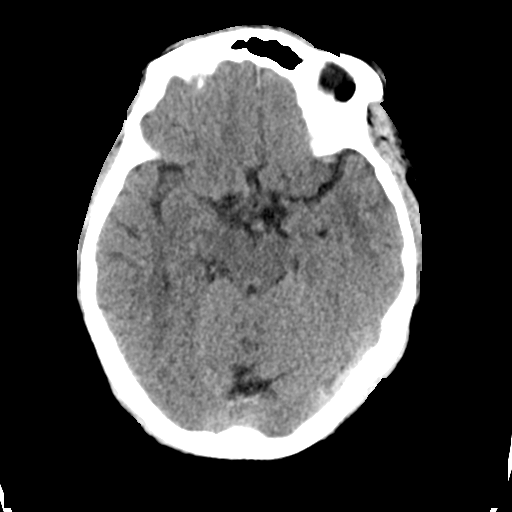
[im 15/33  brain]
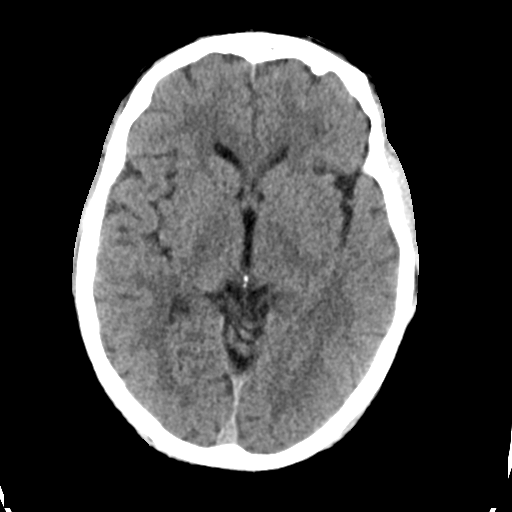
[im 15/33  bone]
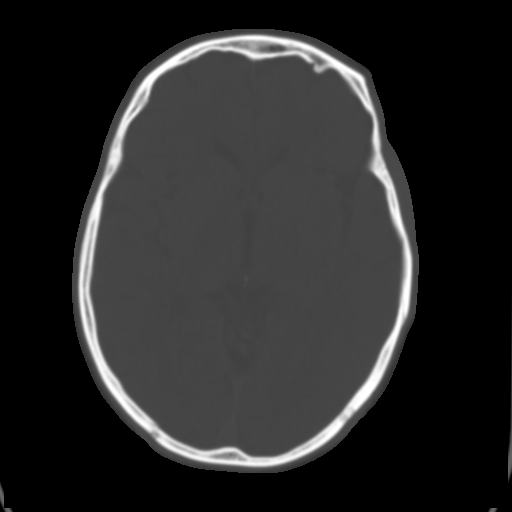
[im 18/33  brain]
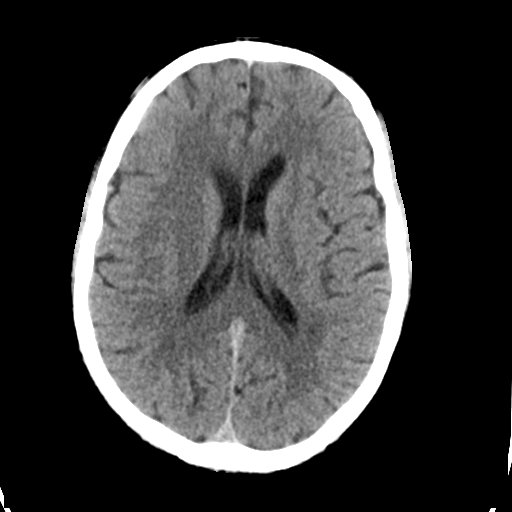
[im 21/33  brain]
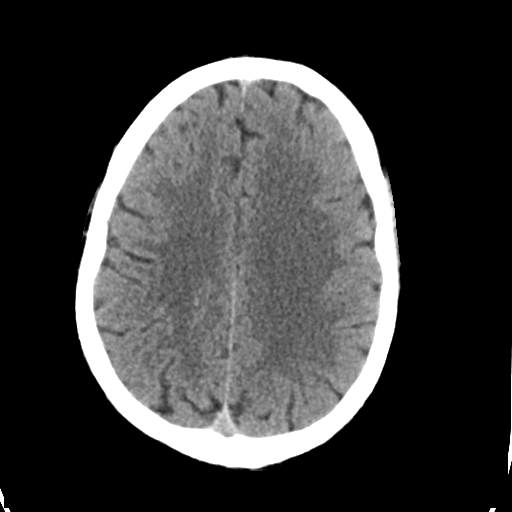
[im 25/33  brain]
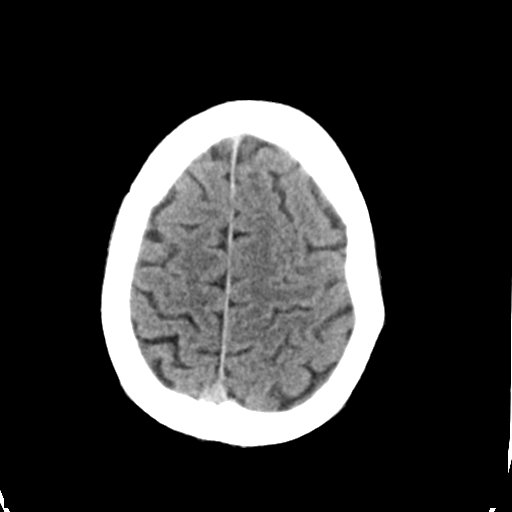
[im 27/33  brain]
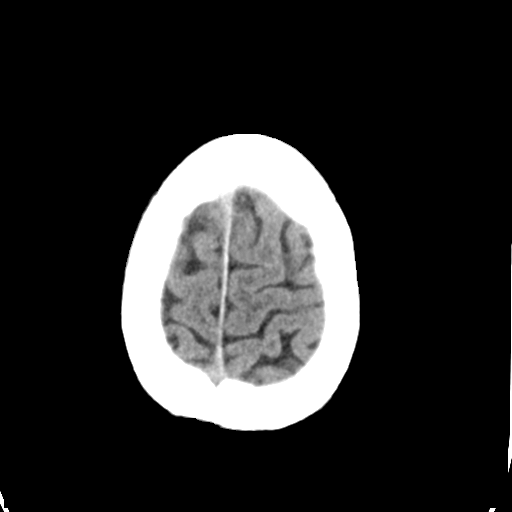
[im 27/33  bone]
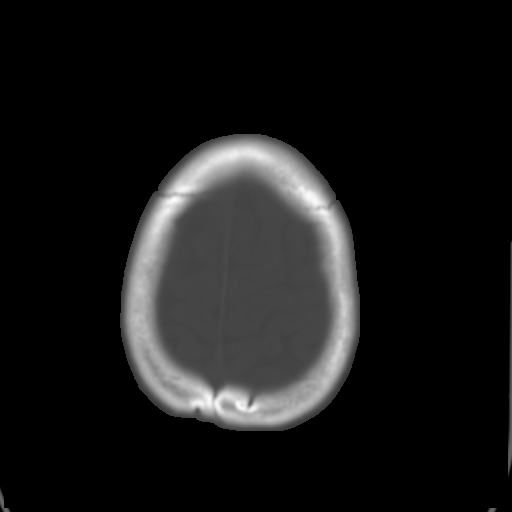
[im 30/33  brain]
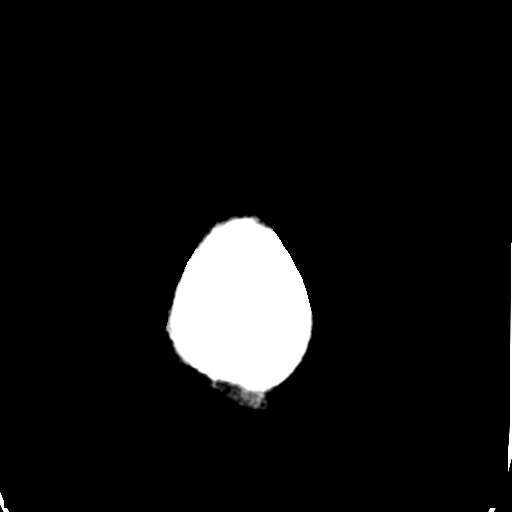

[Series 5: head 3.0 mpr cor · coronal · 0.32mm/px · 3 of 73 slices shown]
[im 25/73  brain]
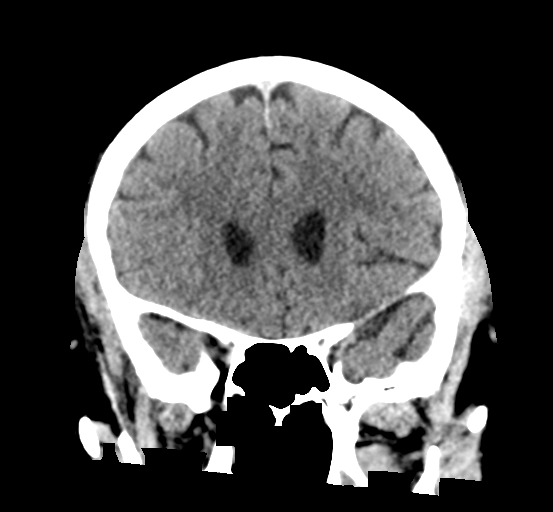
[im 33/73  brain]
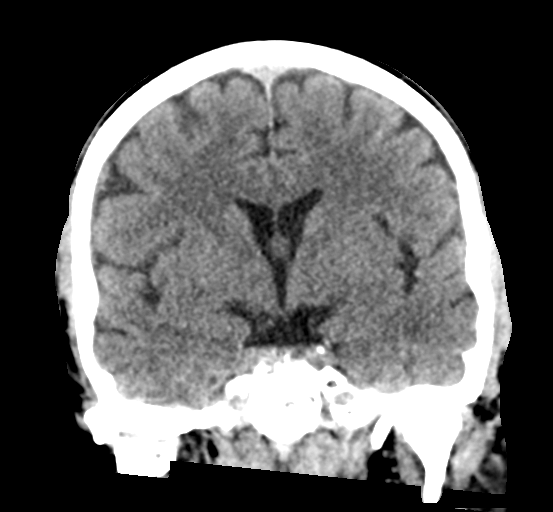
[im 41/73  brain]
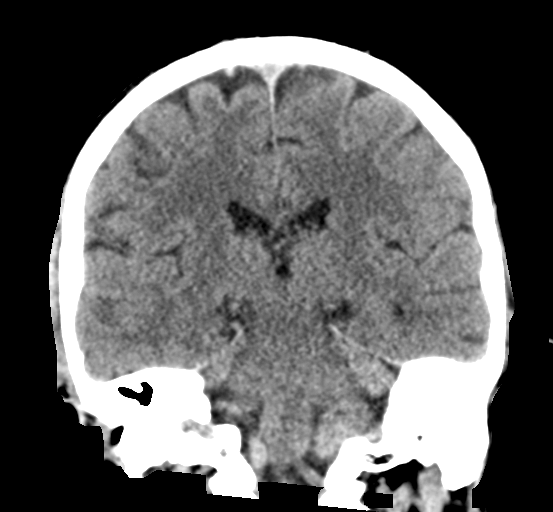

[Series 6: head 3.0 mpr sag · sagittal · 0.35mm/px · 3 of 59 slices shown]
[im 22/59  brain]
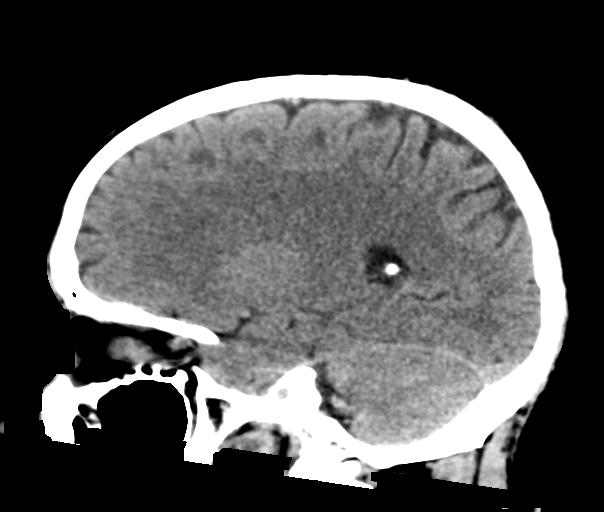
[im 30/59  brain]
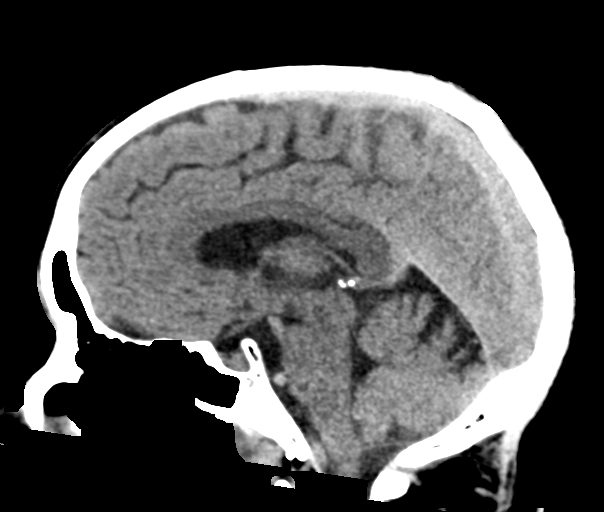
[im 37/59  brain]
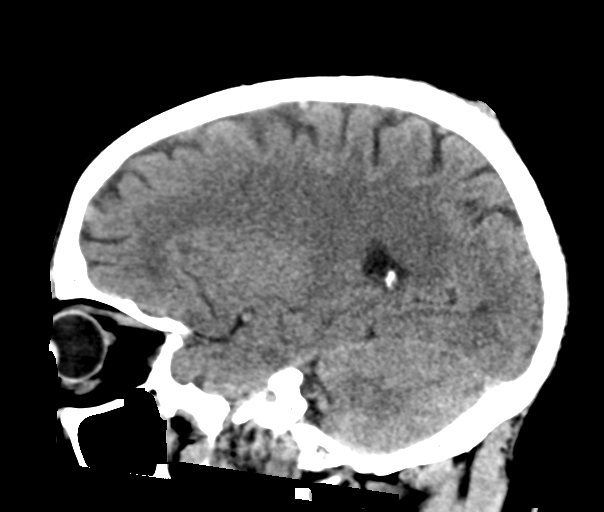

[16 of 47 positions shown; findings below may reference images not displayed]

FINDINGS: Brain: No evidence of acute infarction, hemorrhage, hydrocephalus,
extra-axial collection or mass lesion/mass effect.

Vascular: No hyperdense vessel or unexpected calcification.

Skull: Normal. Negative for fracture or focal lesion.

Sinuses/Orbits: The visualized paranasal sinuses are clear. There is
opacification of the mastoid air cells bilaterally. No air-fluid
levels.

Other: Irregularity of the nasal bone and nasal septum likely
related to old trauma.
IMPRESSION: 1. No acute intracranial pathology.
2. Opacification of the mastoid air cells bilaterally. No air-fluid
levels.

## 2021-01-21 ENCOUNTER — Encounter (HOSPITAL_COMMUNITY): Payer: Self-pay

## 2021-01-21 ENCOUNTER — Emergency Department (HOSPITAL_COMMUNITY): Payer: Medicare Other

## 2021-01-21 ENCOUNTER — Other Ambulatory Visit: Payer: Self-pay

## 2021-01-21 ENCOUNTER — Emergency Department (HOSPITAL_COMMUNITY)
Admission: EM | Admit: 2021-01-21 | Discharge: 2021-01-21 | Disposition: A | Discharge status: Home / Self Care | Payer: Medicare Other | Attending: Emergency Medicine | Admitting: Emergency Medicine

## 2021-01-21 DIAGNOSIS — W01198A Fall on same level from slipping, tripping and stumbling with subsequent striking against other object, initial encounter: Secondary | ICD-10-CM | POA: Insufficient documentation

## 2021-01-21 DIAGNOSIS — I129 Hypertensive chronic kidney disease with stage 1 through stage 4 chronic kidney disease, or unspecified chronic kidney disease: Secondary | ICD-10-CM | POA: Insufficient documentation

## 2021-01-21 DIAGNOSIS — R55 Syncope and collapse: Secondary | ICD-10-CM

## 2021-01-21 DIAGNOSIS — R9431 Abnormal electrocardiogram [ECG] [EKG]: Secondary | ICD-10-CM | POA: Diagnosis not present

## 2021-01-21 DIAGNOSIS — M47812 Spondylosis without myelopathy or radiculopathy, cervical region: Secondary | ICD-10-CM | POA: Diagnosis not present

## 2021-01-21 DIAGNOSIS — R4781 Slurred speech: Secondary | ICD-10-CM | POA: Diagnosis not present

## 2021-01-21 DIAGNOSIS — R404 Transient alteration of awareness: Secondary | ICD-10-CM | POA: Diagnosis not present

## 2021-01-21 DIAGNOSIS — N183 Chronic kidney disease, stage 3 unspecified: Secondary | ICD-10-CM | POA: Insufficient documentation

## 2021-01-21 DIAGNOSIS — S199XXA Unspecified injury of neck, initial encounter: Secondary | ICD-10-CM | POA: Diagnosis not present

## 2021-01-21 DIAGNOSIS — S0990XA Unspecified injury of head, initial encounter: Secondary | ICD-10-CM | POA: Diagnosis present

## 2021-01-21 DIAGNOSIS — Z79899 Other long term (current) drug therapy: Secondary | ICD-10-CM | POA: Diagnosis not present

## 2021-01-21 DIAGNOSIS — Y900 Blood alcohol level of less than 20 mg/100 ml: Secondary | ICD-10-CM | POA: Diagnosis not present

## 2021-01-21 DIAGNOSIS — Z743 Need for continuous supervision: Secondary | ICD-10-CM | POA: Diagnosis not present

## 2021-01-21 DIAGNOSIS — S0081XA Abrasion of other part of head, initial encounter: Secondary | ICD-10-CM | POA: Insufficient documentation

## 2021-01-21 DIAGNOSIS — I499 Cardiac arrhythmia, unspecified: Secondary | ICD-10-CM | POA: Diagnosis not present

## 2021-01-21 DIAGNOSIS — R6889 Other general symptoms and signs: Secondary | ICD-10-CM | POA: Diagnosis not present

## 2021-01-21 LAB — CBC WITH DIFFERENTIAL/PLATELET
Abs Immature Granulocytes: 0.03 10*3/uL (ref 0.00–0.07)
Basophils Absolute: 0 10*3/uL (ref 0.0–0.1)
Basophils Relative: 0 %
Eosinophils Absolute: 0.1 10*3/uL (ref 0.0–0.5)
Eosinophils Relative: 1 %
HCT: 44.8 % (ref 39.0–52.0)
Hemoglobin: 13.6 g/dL (ref 13.0–17.0)
Immature Granulocytes: 0 %
Lymphocytes Relative: 8 %
Lymphs Abs: 0.8 10*3/uL (ref 0.7–4.0)
MCH: 28.7 pg (ref 26.0–34.0)
MCHC: 30.4 g/dL (ref 30.0–36.0)
MCV: 94.5 fL (ref 80.0–100.0)
Monocytes Absolute: 0.4 10*3/uL (ref 0.1–1.0)
Monocytes Relative: 4 %
Neutro Abs: 8.3 10*3/uL — ABNORMAL HIGH (ref 1.7–7.7)
Neutrophils Relative %: 87 %
Platelets: 299 10*3/uL (ref 150–400)
RBC: 4.74 MIL/uL (ref 4.22–5.81)
RDW: 15.5 % (ref 11.5–15.5)
WBC: 9.6 10*3/uL (ref 4.0–10.5)
nRBC: 0 % (ref 0.0–0.2)

## 2021-01-21 LAB — I-STAT VENOUS BLOOD GAS, ED
Acid-base deficit: 3 mmol/L — ABNORMAL HIGH (ref 0.0–2.0)
Bicarbonate: 22.4 mmol/L (ref 20.0–28.0)
Calcium, Ion: 1.05 mmol/L — ABNORMAL LOW (ref 1.15–1.40)
HCT: 44 % (ref 39.0–52.0)
Hemoglobin: 15 g/dL (ref 13.0–17.0)
O2 Saturation: 63 %
Potassium: 4.5 mmol/L (ref 3.5–5.1)
Sodium: 140 mmol/L (ref 135–145)
TCO2: 24 mmol/L (ref 22–32)
pCO2, Ven: 41.2 mmHg — ABNORMAL LOW (ref 44.0–60.0)
pH, Ven: 7.344 (ref 7.250–7.430)
pO2, Ven: 34 mmHg (ref 32.0–45.0)

## 2021-01-21 LAB — URINALYSIS, ROUTINE W REFLEX MICROSCOPIC
Bilirubin Urine: NEGATIVE
Glucose, UA: NEGATIVE mg/dL
Hgb urine dipstick: NEGATIVE
Ketones, ur: NEGATIVE mg/dL
Leukocytes,Ua: NEGATIVE
Nitrite: NEGATIVE
Protein, ur: NEGATIVE mg/dL
Specific Gravity, Urine: 1.01 (ref 1.005–1.030)
pH: 5 (ref 5.0–8.0)

## 2021-01-21 LAB — ETHANOL: Alcohol, Ethyl (B): <10 mg/dL (ref <10)

## 2021-01-21 LAB — COMPREHENSIVE METABOLIC PANEL
ALT: 20 U/L (ref 0–44)
AST: 26 U/L (ref 15–41)
Albumin: 3.9 g/dL (ref 3.5–5.0)
Alkaline Phosphatase: 50 U/L (ref 38–126)
Anion gap: 9 (ref 5–15)
BUN: 9 mg/dL (ref 8–23)
CO2: 20 mmol/L — ABNORMAL LOW (ref 22–32)
Calcium: 9 mg/dL (ref 8.9–10.3)
Chloride: 109 mmol/L (ref 98–111)
Creatinine, Ser: 1.13 mg/dL (ref 0.61–1.24)
GFR, Estimated: >60 mL/min (ref >60)
Glucose, Bld: 85 mg/dL (ref 70–99)
Potassium: 4.3 mmol/L (ref 3.5–5.1)
Sodium: 138 mmol/L (ref 135–145)
Total Bilirubin: 1 mg/dL (ref 0.3–1.2)
Total Protein: 7.2 g/dL (ref 6.5–8.1)

## 2021-01-21 LAB — TROPONIN I (HIGH SENSITIVITY)
Troponin I (High Sensitivity): 8 ng/L (ref <18)
Troponin I (High Sensitivity): 6 ng/L (ref <18)

## 2021-01-21 LAB — I-STAT CHEM 8, ED
BUN: 11 mg/dL (ref 8–23)
Calcium, Ion: 1.05 mmol/L — ABNORMAL LOW (ref 1.15–1.40)
Chloride: 108 mmol/L (ref 98–111)
Creatinine, Ser: 1.1 mg/dL (ref 0.61–1.24)
Glucose, Bld: 86 mg/dL (ref 70–99)
HCT: 46 % (ref 39.0–52.0)
Hemoglobin: 15.6 g/dL (ref 13.0–17.0)
Potassium: 4.5 mmol/L (ref 3.5–5.1)
Sodium: 141 mmol/L (ref 135–145)
TCO2: 23 mmol/L (ref 22–32)

## 2021-01-21 LAB — CK: Total CK: 475 U/L — ABNORMAL HIGH (ref 49–397)

## 2021-01-21 LAB — TSH: TSH: 0.935 u[IU]/mL (ref 0.350–4.500)

## 2021-01-21 LAB — RAPID URINE DRUG SCREEN, HOSP PERFORMED
Amphetamines: NOT DETECTED
Barbiturates: NOT DETECTED
Benzodiazepines: NOT DETECTED
Cocaine: POSITIVE — AB
Opiates: NOT DETECTED
Tetrahydrocannabinol: POSITIVE — AB

## 2021-01-21 LAB — AMMONIA: Ammonia: 63 umol/L — ABNORMAL HIGH (ref 9–35)

## 2021-01-21 LAB — LACTIC ACID, PLASMA
Lactic Acid, Venous: 2.8 mmol/L (ref 0.5–1.9)
Lactic Acid, Venous: 1.4 mmol/L (ref 0.5–1.9)

## 2021-01-21 LAB — CBG MONITORING, ED: Glucose-Capillary: 93 mg/dL (ref 70–99)

## 2021-01-21 MED ORDER — SODIUM CHLORIDE 0.9 % IV BOLUS
1000.0000 mL | Freq: Once | INTRAVENOUS | Status: AC
  Administered 2021-01-21: 1000 mL via INTRAVENOUS

## 2021-01-21 NOTE — ED Provider Notes (Signed)
[facility] EMERGENCY DEPARTMENT Provider Note   CSN: [id number] Arrival date & time: 01/21/21  1232     History Chief Complaint  Patient presents with   Loss of Consciousness    Roy Chavez is a 65 y.o. male history of syncope, anxiety, hypertension, encephalopathy, polysubstance abuse, CKD.  Patient brought in today by EMS for syncopal episode and head injury.  Patient reports he is currently homeless, he reports drinking 40 ounces of beer this morning and recent decreased p.o. intake.  He reports that he passed out and fell and struck the right side of his forehead on the ground.  He reports some mild pain there denies any other injury.  He denies any preceding dizziness, lightheadedness, chest pain or shortness of breath.  He denies any abdominal pain nausea vomiting or diarrhea.  He denies any numbness, weakness, tingling or any additional concerns today.  Additional history is obtained from EMS, they report that in route patient had a episode where he became diaphoretic, blood pressure dropped to 60 systolic and he was bradycardic at 48, this improved spontaneously HPI     Past Medical History:  Diagnosis Date   Anxiety    Hypertension     Patient Active Problem List   Diagnosis Date Noted   Acute encephalopathy 12/17/2016   Substance abuse ([place])    CKD (chronic kidney disease), stage III ([place])    Syncope and collapse    Syncope 12/16/2016   Dehydration 12/16/2016   AKI (acute kidney injury) ([place]) 06/09/2015   Altered mental status 06/09/2015   Pyuria 06/09/2015   Hypertension 06/09/2015   Acute renal insufficiency 06/09/2015    No past surgical history on file.     No family history on file.  Social History   Tobacco Use   Smoking status: Never   Smokeless tobacco: Never  Substance Use Topics   Alcohol use: Yes    Alcohol/week: 40.0 standard drinks    Types: 40 Cans of beer per week    Comment: Pt states he drink one 40oz once  a week   Drug use: No    Home Medications Prior to Admission medications   Medication Sig Start Date End Date Taking? Authorizing Provider  amLODipine (NORVASC) 5 MG tablet Take 1 tablet (5 mg total) by mouth daily. 12/18/16 12/18/17  Geradine Girt, DO  FLUoxetine (PROZAC) 40 MG capsule Take 40 mg by mouth daily. 06/01/15   [provider]  Multiple Vitamin (MULTIVITAMIN WITH MINERALS) TABS tablet Take 1 tablet by mouth daily. One A Day    [provider]    Allergies    Patient has no known allergies.  Review of Systems   Review of Systems Ten systems are reviewed and are negative for acute change except as noted in the HPI  Physical Exam Updated Vital Signs BP (!) 143/110 (BP Location: Right Arm)   Pulse 98   Temp 98.1 F (36.7 C) (Oral)   Resp 12   Ht 5\' 8"  (1.727 m)   Wt 80.3 kg   SpO2 98%   BMI 26.91 kg/m   Physical Exam Constitutional:      General: He is not in acute distress.    Appearance: Normal appearance. He is well-developed. He is not ill-appearing or diaphoretic.  HENT:     Head: Normocephalic.     Jaw: There is normal jaw occlusion.     Comments: Minor abrasion right forehead. Eyes:     General: Vision grossly  intact. Gaze aligned appropriately.     Extraocular Movements: Extraocular movements intact.     Pupils: Pupils are equal, round, and reactive to light.  Neck:     Trachea: Trachea and phonation normal. No tracheal tenderness.  Cardiovascular:     Rate and Rhythm: Normal rate and regular rhythm.     Pulses: Normal pulses.  Pulmonary:     Effort: Pulmonary effort is normal. No respiratory distress.     Breath sounds: Normal breath sounds.  Chest:     Chest wall: No deformity, tenderness or crepitus.  Abdominal:     General: There is no distension.     Palpations: Abdomen is soft.     Tenderness: There is no abdominal tenderness. There is no guarding or rebound.  Musculoskeletal:        General: Normal range of motion.      Cervical back: Normal range of motion. No spinous process tenderness or muscular tenderness.     Comments: No midline C/T/L spinal tenderness to palpation, no paraspinal muscle tenderness, no deformity, crepitus, or step-off noted. No sign of injury to the neck or back.  Pelvis stable to compression bilaterally without pain.  Patient able to bring bilateral knees to chest without pain.  Appropriate range of motion and strength without pain of all major joints of the bilateral upper and lower extremities.  Patient is able to pull himself to a seated position without assistance.  Skin:    General: Skin is warm and dry.  Neurological:     Mental Status: He is alert.     GCS: GCS eye subscore is 4. GCS verbal subscore is 5. GCS motor subscore is 6.     Comments: Speech is clear and goal oriented, follows commands Major Cranial nerves without deficit, no facial droop Moves extremities without ataxia, coordination intact  Psychiatric:        Behavior: Behavior normal.    ED Results / Procedures / Treatments   Labs (all labs ordered are listed, but only abnormal results are displayed) Labs Reviewed  I-STAT CHEM 8, ED - Abnormal; Notable for the following components:      Result Value   Calcium, Ion 1.05 (*)    All other components within normal limits  I-STAT VENOUS BLOOD GAS, ED - Abnormal; Notable for the following components:   pCO2, Ven 41.2 (*)    Acid-base deficit 3.0 (*)    Calcium, Ion 1.05 (*)    All other components within normal limits  CBC WITH DIFFERENTIAL/PLATELET  COMPREHENSIVE METABOLIC PANEL  URINALYSIS, ROUTINE W REFLEX MICROSCOPIC  AMMONIA  TSH  ETHANOL  LACTIC ACID, PLASMA  LACTIC ACID, PLASMA  RAPID URINE DRUG SCREEN, HOSP PERFORMED  CK  CBG MONITORING, ED  TROPONIN I (HIGH SENSITIVITY)  TROPONIN I (HIGH SENSITIVITY)    EKG None  Radiology CT HEAD WO CONTRAST (5MM)  Result Date: 01/21/2021 CLINICAL DATA:  Head trauma, altered mental status EXAM: CT  HEAD WITHOUT CONTRAST TECHNIQUE: Contiguous axial images were obtained from the base of the skull through the vertex without intravenous contrast. COMPARISON:  12/16/2016 FINDINGS: Brain: No acute intracranial hemorrhage, acute infarction, mass lesion, midline shift, herniation, hydrocephalus, or extra-axial fluid collection. Minor scattered patchy white matter microvascular ischemic changes bilaterally. No focal mass effect or edema. Cisterns are patent. No gross cerebellar abnormality. Vascular: No hyperdense vessel or unexpected calcification. Skull: Chronic mastoid effusions bilaterally.  Intact skull. Sinuses/Orbits: Chronic right maxillary sinus mucosal thickening. Other sinuses remain clear. No orbital abnormality.  Other: None. IMPRESSION: Stable head CT. No acute intracranial abnormality by noncontrast imaging. Electronically Signed   By: Judie Petit.  Shick M.D.   On: 01/21/2021 13:49   CT Cervical Spine Wo Contrast  Result Date: 01/21/2021 CLINICAL DATA:  Neck trauma, fall, injury EXAM: CT CERVICAL SPINE WITHOUT CONTRAST TECHNIQUE: Multidetector CT imaging of the cervical spine was performed without intravenous contrast. Multiplanar CT image reconstructions were also generated. COMPARISON:  None. FINDINGS: Alignment: Normal. Skull base and vertebrae: No acute fracture. No primary bone lesion or focal pathologic process. Soft tissues and spinal canal: No prevertebral fluid or swelling. No visible canal hematoma. Disc levels: Progressive multilevel degenerative disc disease most pronounced from C3 through C7. These levels demonstrate marked disc space narrowing, sclerosis and osteophytes. Mild posterior facet arthropathy at multiple levels, slightly worse on the right. No subluxation or dislocation. Facets are aligned. Degenerative changes of the C1-2 articulation as well. Upper chest: Negative. Other: None. IMPRESSION: Progressive cervical degenerative changes as above. No acute osseous finding, fracture or  malalignment by CT. Electronically Signed   By: Judie Petit.  Shick M.D.   On: 01/21/2021 13:54   DG Pelvis Portable  Result Date: 01/21/2021 CLINICAL DATA:  Syncope. EXAM: PORTABLE PELVIS 1-2 VIEWS COMPARISON:  CT abdomen pelvis dated September 16, 2009. FINDINGS: There is no evidence of pelvic fracture or diastasis. No pelvic bone lesions are seen. IMPRESSION: Negative. Electronically Signed   By: Obie Dredge M.D.   On: 01/21/2021 13:03   DG Chest Portable 1 View  Result Date: 01/21/2021 CLINICAL DATA:  Fall, syncope. EXAM: PORTABLE CHEST 1 VIEW COMPARISON:  December 16, 2016. FINDINGS: The heart size and mediastinal contours are within normal limits. Both lungs are clear. The visualized skeletal structures are unremarkable. IMPRESSION: No active disease. Electronically Signed   By: Lupita Raider M.D.   On: 01/21/2021 13:11    Procedures Procedures   Medications Ordered in ED Medications  sodium chloride 0.9 % bolus 1,000 mL (1,000 mLs Intravenous New Bag/Given 01/21/21 1433)    ED Course  I have reviewed the triage vital signs and the nursing notes.  Pertinent labs & imaging results that were available during my care of the patient were reviewed by me and considered in my medical decision making (see chart for details).    MDM Rules/Calculators/A&P                           Additional history obtained from: Nursing notes from this visit. P review of electronic medical records ---- CT CSPINE:  IMPRESSION:  Progressive cervical degenerative changes as above. No acute osseous  finding, fracture or malalignment by CT.      CT Head:  IMPRESSION:  Stable head CT. No acute intracranial abnormality by noncontrast  imaging.   CXR:  IMPRESSION:  No active disease.   DG Pelvis:  IMPRESSION:  Negative.    Lab work is pending.  Patient was reassessed he is resting comfortably bed no acute distress vital signs are stable he denies any pain at this time.  Care handoff given to [facility] at shift change.  Plan of care is to follow-up on labs, reassess after IV fluids, orthostatics.  Final disposition per oncoming team.  Patient seen and evaluated by Dr. Rush Landmark during this visit who agrees with plan.  Note: Portions of this report may have been transcribed using voice recognition software. Every effort was made to ensure accuracy; however, inadvertent computerized transcription errors may  still be present.  Final Clinical Impression(s) / ED Diagnoses Final diagnoses:  None    Rx / DC Orders ED Discharge Orders     None        Gari Crown 01/21/21 1503    Tegeler, Gwenyth Allegra, MD 01/22/21 [phone]

## 2021-01-21 NOTE — ED Notes (Signed)
Pt ambulated with steady gait in room.

## 2021-01-21 NOTE — ED Triage Notes (Signed)
[facility] from [facility] due [facility]ness. Friend states pt has "passed out a few times this morning". Upon EMS arrival pt CAOx4. Pt states he drank a 40oz of beer today [facility] reports pt became lethargic and diaphoretic and BP 60/48 with HR of 48. Pt becoming more alert and BP 112/72 HR-92/ Pt states he fell and has abrasion to forehead area. Pt denies any neck or back pain.

## 2021-01-21 NOTE — ED Provider Notes (Signed)
Care received from Mr. Pickert from [place] at shift change.  Patient has had majority of his work-up completed including all of the imaging studies.  At this time we are waiting for repeat troponin, repeat lactic acid, UDS, and ammonia level.  If without significant findings on the studies per signout team patient is appropriate for discharge and his syncopal episode is likely secondary to orthostasis.  Patient did receive some fluids already.  Patient was monitored in the emergency room.  There was some difficulty obtaining the repeat lactic acid from lab standpoint.  Repeat lactic acid improved from 2.8-1.4.  Patient completed his fluids.  CK was elevated at 475 but patient has received fluids.  Patient's TSH is 0.9.  Ammonia level at 63.  Patient's UDS was positive for cocaine and THC otherwise his UA was unremarkable, CBC without leukocytosis or anemia, CMP unremarkable with the exception of CO2 of 20.  Patient was reevaluated prior to discharge.  Orthostatics were done by me at bedside and patient was without lightheadedness and blood pressure readings were 127/91 on lying down with rate of 84, and on standing 149/82 with rate of 86.  Patient is appropriate for discharge.  Discussed importance of follow-up with his PCP.   Marita [place], PA-C 01/21/21 2121    Benjiman Core, MD 01/22/21 [phone]

## 2021-01-21 NOTE — Discharge Instructions (Addendum)
Your work-up today was reassuring that your passing out episode was not a result of your heart, or rhythm issue.  It is likely that it was secondary to dehydration.  In the emergency room he did receive IV fluids.  Following IV fluids you ambulated around the unit without difficulty.  Your blood pressure is much improved.  Your CT scans and x-rays were reassuring.  Follow-up with your PCP.  Return to the emergency room if you have additional episodes of passing out, or develop chest pain, irregular heartbeat.

## 2021-02-03 ENCOUNTER — Other Ambulatory Visit (HOSPITAL_COMMUNITY): Payer: Self-pay

## 2021-02-03 ENCOUNTER — Encounter: Payer: Self-pay | Admitting: Physician Assistant

## 2021-02-03 ENCOUNTER — Other Ambulatory Visit: Payer: Self-pay | Admitting: Critical Care Medicine

## 2021-02-03 MED ORDER — FLUOXETINE HCL 20 MG PO CAPS
20.0000 mg | ORAL_CAPSULE | Freq: Every day | ORAL | 3 refills | Status: DC
Start: 2021-02-03 — End: 2021-03-30
  Filled 2021-02-03: qty 60, 60d supply, fill #0

## 2021-02-03 MED ORDER — AMLODIPINE BESYLATE 5 MG PO TABS
5.0000 mg | ORAL_TABLET | Freq: Every day | ORAL | 0 refills | Status: DC
  Filled 2021-02-03: qty 60, 60d supply, fill #0

## 2021-02-03 MED ORDER — ATORVASTATIN CALCIUM 20 MG PO TABS
20.0000 mg | ORAL_TABLET | Freq: Every day | ORAL | 3 refills | Status: DC
Start: 2021-02-03 — End: 2021-03-30
  Filled 2021-02-03: qty 90, 90d supply, fill #0

## 2021-02-03 MED ORDER — LISINOPRIL 20 MG PO TABS
20.0000 mg | ORAL_TABLET | Freq: Every day | ORAL | 3 refills | Status: DC
Start: 2021-02-03 — End: 2021-03-30
  Filled 2021-02-03: qty 60, 60d supply, fill #0

## 2021-02-03 NOTE — Progress Notes (Addendum)
Pt seen by Dr Delford Field.   [facility] is new to the shelter. Phone [phone]  He was in the ER 11/18 for syncope and collapse. BP was 60/48 at first w/ HR 48. This happened after drinking a 40 oz beer and had not eaten. He has been careful to stay hydrated since then.   BP later in the ER was 143/110, more normal for him.  Hx HTN and anxiety, off meds for a long time. ER did not restart any meds. He has not been drinking, no additional episodes presyncope or syncope.   He does not know when he last had a checkup.   His toenails are long, he uses his hand to trim them.   He was given foot cream.   He was restarted on his Prozac at 20 mg qd and amlodipine 5 mg qd.   From Care Everywhere, he was restarted on Lisinopril and Lipitor, hydroxyzine at the doses below.  hydrOXYzine HCl 25 MG   TAKE 1 TABLET BY MOUTH UP TO 4 TIMES DAILY AS NEEDED FOR ANXIETY OR SLEEP. 90 DAYS.  Atorvastatin Calcium 20 MG Orally Once a day 1 tablet  Lisinopril 20 MG   TAKE 1 TABLET BY MOUTH ONCE DAILY  FLUoxetine HCl 40 MG   TAKE 1 CAPSULE BY MOUTH ONCE DAILY  One Daily Mens -      180/102, 78, 98%  Ramonita Koenig, PA-C 02/03/2021 2:56 PM

## 2021-02-16 ENCOUNTER — Other Ambulatory Visit: Payer: Self-pay | Admitting: Critical Care Medicine

## 2021-02-16 DIAGNOSIS — B351 Tinea unguium: Secondary | ICD-10-CM

## 2021-02-23 ENCOUNTER — Ambulatory Visit (INDEPENDENT_AMBULATORY_CARE_PROVIDER_SITE_OTHER): Payer: Medicare Other | Admitting: Podiatry

## 2021-02-23 ENCOUNTER — Other Ambulatory Visit: Payer: Self-pay

## 2021-02-23 ENCOUNTER — Encounter: Payer: Self-pay | Admitting: Podiatry

## 2021-02-23 DIAGNOSIS — M79674 Pain in right toe(s): Secondary | ICD-10-CM

## 2021-02-23 DIAGNOSIS — N183 Chronic kidney disease, stage 3 unspecified: Secondary | ICD-10-CM | POA: Diagnosis not present

## 2021-02-23 DIAGNOSIS — B351 Tinea unguium: Secondary | ICD-10-CM | POA: Diagnosis not present

## 2021-02-23 DIAGNOSIS — M79675 Pain in left toe(s): Secondary | ICD-10-CM

## 2021-02-23 NOTE — Progress Notes (Signed)
°  Subjective:  Patient ID: Roy Chavez, male    DOB: [birth year],   MRN: [id number]  Chief Complaint  Patient presents with   Nail Problem    RFC- Bilateral nail trim 105. Long thick discolored nails.     65 y.o. male presents for concern of thickened elongated and painful toenails that have been present for many years. Patient relates he is unable to trim them himself. Has a history of stage III CKD.  Denies any other pedal complaints. Denies n/v/f/c.   PCP: Leilani Able MD   Past Medical History:  Diagnosis Date   Anxiety    Hypertension     Objective:  Physical Exam: Vascular: DP/PT pulses 2/4 bilateral. CFT <3 seconds. Normal hair growth on digits. No edema.  Skin. No lacerations or abrasions bilateral feet. Nails 1-5 are thickened discolored and elongated with subungual debris.  Musculoskeletal: MMT 5/5 bilateral lower extremities in DF, PF, Inversion and Eversion. Deceased ROM in DF of ankle joint.  Neurological: Sensation intact to light touch.   Assessment:   1. Stage 3 chronic kidney disease, unspecified whether stage 3a or 3b CKD (HCC)   2. Onychomycosis      Plan:  Patient was evaluated and treated and all questions answered. -Discussed and educated patient on  foot care, especially with  regards to the vascular, neurological and musculoskeletal systems.  -Discussed supportive shoes at all times and checking feet regularly.  -Mechanically debrided all nails 1-5 bilateral using sterile nail nipper and filed with dremel without incident  -Answered all patient questions -Patient to return  in 3 months for at risk foot care -Patient advised to call the office if any problems or questions arise in the meantime.   Louann Sjogren, DPM

## 2021-03-30 ENCOUNTER — Other Ambulatory Visit: Payer: Self-pay | Admitting: *Deleted

## 2021-03-30 MED ORDER — ATORVASTATIN CALCIUM 20 MG PO TABS: 20.0000 mg | ORAL_TABLET | Freq: Every day | ORAL | 3 refills | Status: DC

## 2021-03-30 MED ORDER — AMLODIPINE BESYLATE 5 MG PO TABS: 5.0000 mg | ORAL_TABLET | Freq: Every day | ORAL | 0 refills | Status: DC

## 2021-03-30 MED ORDER — FLUOXETINE HCL 20 MG PO CAPS: 20.0000 mg | ORAL_CAPSULE | Freq: Every day | ORAL | 3 refills | Status: DC

## 2021-03-30 MED ORDER — LISINOPRIL 20 MG PO TABS: 20.0000 mg | ORAL_TABLET | Freq: Every day | ORAL | 3 refills | Status: DC

## 2021-04-27 ENCOUNTER — Encounter (HOSPITAL_COMMUNITY): Payer: Self-pay

## 2021-04-27 ENCOUNTER — Ambulatory Visit (HOSPITAL_COMMUNITY)
Admission: EM | Admit: 2021-04-27 | Discharge: 2021-04-27 | Disposition: A | Discharge status: Home / Self Care | Payer: Medicare Other | Attending: Emergency Medicine | Admitting: Emergency Medicine

## 2021-04-27 ENCOUNTER — Other Ambulatory Visit: Payer: Self-pay

## 2021-04-27 DIAGNOSIS — L03012 Cellulitis of left finger: Secondary | ICD-10-CM

## 2021-04-27 MED ORDER — DOXYCYCLINE HYCLATE 100 MG PO CAPS: 100.0000 mg | ORAL_CAPSULE | Freq: Two times a day (BID) | ORAL | 0 refills | Status: DC

## 2021-04-27 NOTE — ED Triage Notes (Signed)
Pt c/o lt ring finger nail infection x1wk.

## 2021-04-27 NOTE — Discharge Instructions (Signed)
Soak your finger in warm salt water at least twice a day.   Follow up with the hand specialist at [facility] if your finger is not getting better with soaking it and antibiotics.

## 2021-04-27 NOTE — ED Provider Notes (Signed)
[place]    CSN: [id number] Arrival date & time: 04/27/21  1151      History   Chief Complaint Chief Complaint  Patient presents with   Finger Injury    HPI Roy Chavez is a 66 y.o. male. He reports 1 week of fingertip pain and swelling. Has not tried anything to help treat problem. Denies injury.   HPI  Past Medical History:  Diagnosis Date   Anxiety    CKD (chronic kidney disease), stage III ([place])    Hypertension     Patient Active Problem List   Diagnosis Date Noted   Acute encephalopathy 12/17/2016   Substance abuse ([place])    CKD (chronic kidney disease), stage III ([place])    Syncope and collapse    Syncope 12/16/2016   Dehydration 12/16/2016   AKI (acute kidney injury) ([place]) 06/09/2015   Altered mental status 06/09/2015   Pyuria 06/09/2015   Hypertension 06/09/2015   Acute renal insufficiency 06/09/2015    History reviewed. No pertinent surgical history.     Home Medications    Prior to Admission medications   Medication Sig Start Date End Date Taking? Authorizing Provider  doxycycline (VIBRAMYCIN) 100 MG capsule Take 1 capsule (100 mg total) by mouth 2 (two) times daily. 04/27/21  Yes Carvel Getting, NP  amLODipine (NORVASC) 5 MG tablet Take 1 tablet (5 mg total) by mouth daily. 03/30/21   Elsie Stain, MD  atorvastatin (LIPITOR) 20 MG tablet Take 1 tablet (20 mg total) by mouth daily. 03/30/21   Elsie Stain, MD  FLUoxetine (PROZAC) 20 MG capsule Take 1 capsule (20 mg total) by mouth daily. 03/30/21   Elsie Stain, MD  lisinopril (ZESTRIL) 20 MG tablet Take 1 tablet (20 mg total) by mouth daily. 03/30/21   Elsie Stain, MD  Multiple Vitamin (MULTIVITAMIN WITH MINERALS) TABS tablet Take 1 tablet by mouth daily. One A Day    [provider]    Family History History reviewed. No pertinent family history.  Social History Social History   Tobacco Use   Smoking status: Never   Smokeless tobacco: Never   Substance Use Topics   Alcohol use: Yes    Alcohol/week: 40.0 standard drinks    Types: 40 Cans of beer per week    Comment: Pt states he drink one 40oz once a week   Drug use: No     Allergies   Patient has no known allergies.   Review of Systems Review of Systems  Constitutional:  Negative for chills and fever.  Skin:        Swelling and pain in L ring fingertip     Physical Exam Triage Vital Signs ED Triage Vitals  Enc Vitals Group     BP 04/27/21 1221 126/79     Pulse Rate 04/27/21 1221 96     Resp 04/27/21 1221 18     Temp 04/27/21 1221 (!) 82 F (27.8 C)     Temp Source 04/27/21 1221 Oral     SpO2 04/27/21 1221 96 %     Weight --      Height --      Head Circumference --      Peak Flow --      Pain Score 04/27/21 1222 2     Pain Loc --      Pain Edu? --      Excl. in [place]? --    No data found.  Updated Vital Signs  BP 126/79 (BP Location: Left Arm)    Pulse 96    Temp (!) 82 F (27.8 C) (Oral)    Resp 18    SpO2 96%   Visual Acuity Right Eye Distance:   Left Eye Distance:   Bilateral Distance:    Right Eye Near:   Left Eye Near:    Bilateral Near:     Physical Exam Constitutional:      Appearance: Normal appearance. He is not ill-appearing.  Pulmonary:     Effort: Pulmonary effort is normal.  Skin:    Comments: Pad of left ring fingertip mildly swollen with tenderness to palpation; no palpable area of fluctuance; area around nailbed nontender, no edema.   Neurological:     Mental Status: He is alert.     UC Treatments / Results  Labs (all labs ordered are listed, but only abnormal results are displayed) Labs Reviewed - No data to display  EKG   Radiology No results found.  Procedures Procedures (including critical care time)  Medications Ordered in UC Medications - No data to display  Initial Impression / Assessment and Plan / UC Course  I have reviewed the triage vital signs and the nursing notes.  Pertinent labs & imaging  results that were available during my care of the patient were reviewed by me and considered in my medical decision making (see chart for details).     Likely felon. Rx doxycycline, f/u with hand if not improving.   Final Clinical Impressions(s) / UC Diagnoses   Final diagnoses:  Felon of finger of left hand     Discharge Instructions      Soak your finger in warm salt water at least twice a day.   Follow up with the hand specialist at [facility] if your finger is not getting better with soaking it and antibiotics.    ED Prescriptions     Medication Sig Dispense Auth. Provider   doxycycline (VIBRAMYCIN) 100 MG capsule Take 1 capsule (100 mg total) by mouth 2 (two) times daily. 20 capsule Carvel Getting, NP      PDMP not reviewed this encounter.   Carvel Getting, NP 04/27/21 1252

## 2021-05-12 ENCOUNTER — Emergency Department (HOSPITAL_COMMUNITY)
Admission: EM | Admit: 2021-05-12 | Discharge: 2021-05-12 | Disposition: A | Discharge status: Home / Self Care | Payer: Medicare Other | Attending: Emergency Medicine | Admitting: Emergency Medicine

## 2021-05-12 ENCOUNTER — Other Ambulatory Visit: Payer: Self-pay

## 2021-05-12 ENCOUNTER — Encounter (HOSPITAL_COMMUNITY): Payer: Self-pay

## 2021-05-12 DIAGNOSIS — N189 Chronic kidney disease, unspecified: Secondary | ICD-10-CM | POA: Diagnosis not present

## 2021-05-12 DIAGNOSIS — Z79899 Other long term (current) drug therapy: Secondary | ICD-10-CM | POA: Diagnosis not present

## 2021-05-12 DIAGNOSIS — I129 Hypertensive chronic kidney disease with stage 1 through stage 4 chronic kidney disease, or unspecified chronic kidney disease: Secondary | ICD-10-CM | POA: Insufficient documentation

## 2021-05-12 DIAGNOSIS — L608 Other nail disorders: Secondary | ICD-10-CM | POA: Diagnosis not present

## 2021-05-12 DIAGNOSIS — L609 Nail disorder, unspecified: Secondary | ICD-10-CM | POA: Diagnosis not present

## 2021-05-12 MED ORDER — NAPROXEN 250 MG PO TABS
375.0000 mg | ORAL_TABLET | Freq: Once | ORAL | Status: AC
Start: 2021-05-12 — End: 2021-05-12
  Administered 2021-05-12: 19:00:00 375 mg via ORAL
  Filled 2021-05-12: qty 2

## 2021-05-12 MED ORDER — DOXYCYCLINE HYCLATE 100 MG PO CAPS
100.0000 mg | ORAL_CAPSULE | Freq: Two times a day (BID) | ORAL | 0 refills | Status: DC
  Filled 2021-05-12: qty 20, 10d supply, fill #0

## 2021-05-12 MED ORDER — NAPROXEN 375 MG PO TABS
375.0000 mg | ORAL_TABLET | Freq: Two times a day (BID) | ORAL | 0 refills | Status: DC
  Filled 2021-05-12: qty 20, 10d supply, fill #0

## 2021-05-12 MED ORDER — DOXYCYCLINE HYCLATE 100 MG PO TABS
100.0000 mg | ORAL_TABLET | Freq: Once | ORAL | Status: AC
  Administered 2021-05-12: 19:00:00 100 mg via ORAL
  Filled 2021-05-12: qty 1

## 2021-05-12 NOTE — ED Notes (Signed)
Received verbal report from Emily S RN at this time ?

## 2021-05-12 NOTE — ED Provider Notes (Signed)
?[facility] EMERGENCY DEPARTMENT ?Provider Note ? ? ?CSN: [id number] ?Arrival date & time: 05/12/21  1444 ? ?  ? ?History ? ?Chief Complaint  ?Patient presents with  ? Finger Injury  ? ? ?Patterson Hollenbaugh is a 66 y.o. male. ? ?HPI ? ?Patient has history of hypertension anxiety chronic kidney disease.  Patient presents to the ED for evaluation of her sister fingernail irritation.  Patient states he has had pain in the lateral aspect of his cuticle of his left ring finger.  Patient states the symptoms have been going on for couple weeks now.  Records reveal he went to an urgent care on the 22nd.  He was diagnosed with a felon and put on antibiotics.  He was referred to a Hydrographic surveyor.  Patient states the swelling has decreased but it still is tender and persistent.  He has not noticed any drainage.  No fevers or chills ? ?Home Medications ?Prior to Admission medications   ?Medication Sig Start Date End Date Taking? Authorizing Provider  ?amLODipine (NORVASC) 5 MG tablet Take 1 tablet (5 mg total) by mouth daily. 03/30/21   Storm Frisk, MD  ?atorvastatin (LIPITOR) 20 MG tablet Take 1 tablet (20 mg total) by mouth daily. 03/30/21   Storm Frisk, MD  ?doxycycline (VIBRAMYCIN) 100 MG capsule Take 1 capsule (100 mg total) by mouth 2 (two) times daily. 05/12/21  Yes Linwood Dibbles, MD  ?FLUoxetine (PROZAC) 20 MG capsule Take 1 capsule (20 mg total) by mouth daily. 03/30/21   Storm Frisk, MD  ?lisinopril (ZESTRIL) 20 MG tablet Take 1 tablet (20 mg total) by mouth daily. 03/30/21   Storm Frisk, MD  ?Multiple Vitamin (MULTIVITAMIN WITH MINERALS) TABS tablet Take 1 tablet by mouth daily. One A Day    [provider]  ?naproxen (NAPROSYN) 375 MG tablet Take 1 tablet (375 mg total) by mouth 2 (two) times daily. 05/12/21  Yes Linwood Dibbles, MD  ?   ? ?Allergies    ?Patient has no known allergies.   ? ?Review of Systems   ?Review of Systems ? ?Physical Exam ?Updated Vital Signs ?BP (!) 126/91 (BP  Location: Right Arm)   Pulse 80   Temp 97.7 ?F (36.5 ?C) (Oral)   Resp 16   Ht 1.727 m (5\' 8" )   Wt 80.3 kg   SpO2 95%   BMI 26.92 kg/m?  ?Physical Exam ?Vitals and nursing note reviewed.  ?Constitutional:   ?   General: He is not in acute distress. ?   Appearance: He is well-developed.  ?HENT:  ?   Head: Normocephalic and atraumatic.  ?   Right Ear: External ear normal.  ?   Left Ear: External ear normal.  ?Eyes:  ?   General: No scleral icterus.    ?   Right eye: No discharge.     ?   Left eye: No discharge.  ?   Conjunctiva/sclera: Conjunctivae normal.  ?Neck:  ?   Trachea: No tracheal deviation.  ?Cardiovascular:  ?   Rate and Rhythm: Normal rate.  ?Pulmonary:  ?   Effort: Pulmonary effort is normal. No respiratory distress.  ?   Breath sounds: No stridor.  ?Abdominal:  ?   General: There is no distension.  ?Musculoskeletal:     ?   General: No swelling, tenderness or deformity.  ?   Cervical back: Neck supple.  ?   Comments: Patient does not have any significant tenderness, there is no erythema  there is no purulent drainage, no paronychia noted, no signs of felon, patient's cuticles are and hypertrophied.  ?Skin: ?   General: Skin is warm and dry.  ?   Findings: No rash.  ?Neurological:  ?   Mental Status: He is alert.  ?   Cranial Nerves: Cranial nerve deficit: no gross deficits.  ? ? ?ED Results / Procedures / Treatments   ?Labs ?(all labs ordered are listed, but only abnormal results are displayed) ?Labs Reviewed - No data to display ? ?EKG ?None ? ?Radiology ?No results found. ? ?Procedures ?Procedures  ? ? ?Medications Ordered in ED ?Medications  ?doxycycline (VIBRA-TABS) tablet 100 mg (has no administration in time range)  ?naproxen (NAPROSYN) tablet 375 mg (has no administration in time range)  ? ? ?ED Course/ Medical Decision Making/ A&P ?  ?                        ?Medical Decision Making ?Risk ?Prescription drug management. ? ? ?I suspect patient has resolving infection.  There is no erythema.   Mostly callus now.  No signs of felon or paronychia.  We will have him continue warm soaks now.  We will also continue a short course of antibiotics. ? ? ? ? ? ? ? ?Final Clinical Impression(s) / ED Diagnoses ?Final diagnoses:  ?Ragged cuticle  ? ? ?Rx / DC Orders ?ED Discharge Orders   ? ?      Ordered  ?  doxycycline (VIBRAMYCIN) 100 MG capsule  2 times daily       ? 05/12/21 1913  ?  naproxen (NAPROSYN) 375 MG tablet  2 times daily       ? 05/12/21 1913  ? ?  ?  ? ?  ? ? ?  ?Linwood Dibbles, MD ?05/12/21 1916 ? ?

## 2021-05-12 NOTE — ED Triage Notes (Addendum)
Pt arrived via POV. Pt states he had a hang nail on the left fourth digit of hand. Pt has a possible ingrown nail. The inner side of fourth digit is swollen and tenderx1 mo. ?

## 2021-05-12 NOTE — Discharge Instructions (Addendum)
Apply antibiotic ointment to the cuticle.  Soak your finger in warm water and Epsom salts.  Take the medications as prescribed.  Return to the ED as needed for worsening symptoms. ?

## 2021-05-12 NOTE — ED Provider Triage Note (Signed)
Emergency Medicine Provider Triage Evaluation Note ? ?Roy Chavez , a 66 y.o. male  was evaluated in triage.  Pt complains of ingrown nail.  Patient states that he noticed pain to the left ring finger about 1 week ago.  He states that it feels like an ingrown nail.  He has been picking at it.  He denies any fevers or swelling of the finger.. ? ?Review of Systems  ?Positive: See above ?Negative:  ? ?Physical Exam  ?BP 112/81 (BP Location: Left Arm)   Pulse 85   Temp 97.7 ?F (36.5 ?C) (Oral)   Resp 18   Ht 5\' 8"  (1.727 m)   Wt 80.3 kg   SpO2 95%   BMI 26.92 kg/m?  ?Gen:   Awake, no distress   ?Resp:  Normal effort  ?MSK:   Moves extremities without difficulty  ?Other:  Left ring finger with callus to the lateral nail fold.  There is what appears to be a break in the skin to the distal phalanx with bloody discharge.  No obvious paronychia or felon ? ?Medical Decision Making  ?Medically screening exam initiated at 3:56 PM.  Appropriate orders placed.  Amer Alcindor was informed that the remainder of the evaluation will be completed by another provider, this initial triage assessment does not replace that evaluation, and the importance of remaining in the ED until their evaluation is complete. ? ? ?  ?Roy Maya, PA-C ?05/12/21 1558 ? ?

## 2021-05-13 ENCOUNTER — Other Ambulatory Visit (HOSPITAL_COMMUNITY): Payer: Self-pay

## 2021-05-23 ENCOUNTER — Other Ambulatory Visit (HOSPITAL_COMMUNITY): Payer: Self-pay

## 2021-06-02 ENCOUNTER — Other Ambulatory Visit: Payer: Self-pay | Admitting: *Deleted

## 2021-06-02 ENCOUNTER — Other Ambulatory Visit: Payer: Self-pay | Admitting: Critical Care Medicine

## 2021-06-02 MED ORDER — ATORVASTATIN CALCIUM 20 MG PO TABS: 20.0000 mg | ORAL_TABLET | Freq: Every day | ORAL | 3 refills | Status: AC

## 2021-06-02 MED ORDER — LISINOPRIL 20 MG PO TABS: 20.0000 mg | ORAL_TABLET | Freq: Every day | ORAL | 3 refills | Status: DC

## 2021-06-02 MED ORDER — AMLODIPINE BESYLATE 5 MG PO TABS: 5.0000 mg | ORAL_TABLET | Freq: Every day | ORAL | 0 refills | Status: DC

## 2021-06-02 MED ORDER — FLUOXETINE HCL 20 MG PO CAPS: 20.0000 mg | ORAL_CAPSULE | Freq: Every day | ORAL | 3 refills | Status: AC

## 2021-07-05 DIAGNOSIS — Z23 Encounter for immunization: Secondary | ICD-10-CM | POA: Diagnosis not present

## 2021-07-05 DIAGNOSIS — Z0000 Encounter for general adult medical examination without abnormal findings: Secondary | ICD-10-CM | POA: Diagnosis not present

## 2021-07-05 DIAGNOSIS — F17210 Nicotine dependence, cigarettes, uncomplicated: Secondary | ICD-10-CM | POA: Diagnosis not present

## 2021-07-05 DIAGNOSIS — Z79899 Other long term (current) drug therapy: Secondary | ICD-10-CM | POA: Diagnosis not present

## 2021-07-05 DIAGNOSIS — Z1159 Encounter for screening for other viral diseases: Secondary | ICD-10-CM | POA: Diagnosis not present

## 2021-07-05 DIAGNOSIS — L603 Nail dystrophy: Secondary | ICD-10-CM | POA: Diagnosis not present

## 2021-07-05 DIAGNOSIS — R109 Unspecified abdominal pain: Secondary | ICD-10-CM | POA: Diagnosis not present

## 2021-07-05 DIAGNOSIS — I509 Heart failure, unspecified: Secondary | ICD-10-CM | POA: Diagnosis not present

## 2021-07-13 DIAGNOSIS — I10 Essential (primary) hypertension: Secondary | ICD-10-CM | POA: Diagnosis not present

## 2021-07-13 DIAGNOSIS — F02B0 Dementia in other diseases classified elsewhere, moderate, without behavioral disturbance, psychotic disturbance, mood disturbance, and anxiety: Secondary | ICD-10-CM | POA: Diagnosis not present

## 2021-07-13 DIAGNOSIS — E785 Hyperlipidemia, unspecified: Secondary | ICD-10-CM | POA: Diagnosis not present

## 2021-07-13 DIAGNOSIS — Z0001 Encounter for general adult medical examination with abnormal findings: Secondary | ICD-10-CM | POA: Diagnosis not present

## 2021-07-13 DIAGNOSIS — Z789 Other specified health status: Secondary | ICD-10-CM | POA: Diagnosis not present

## 2021-07-13 DIAGNOSIS — G309 Alzheimer's disease, unspecified: Secondary | ICD-10-CM | POA: Diagnosis not present

## 2021-07-19 ENCOUNTER — Other Ambulatory Visit: Payer: Self-pay | Admitting: Family Medicine

## 2021-07-19 DIAGNOSIS — R109 Unspecified abdominal pain: Secondary | ICD-10-CM

## 2021-08-04 ENCOUNTER — Other Ambulatory Visit: Payer: Self-pay | Admitting: Critical Care Medicine

## 2021-08-11 ENCOUNTER — Other Ambulatory Visit: Payer: Self-pay

## 2021-08-11 ENCOUNTER — Encounter (HOSPITAL_COMMUNITY): Payer: Self-pay

## 2021-08-11 ENCOUNTER — Emergency Department (HOSPITAL_COMMUNITY): Payer: Medicare Other

## 2021-08-11 ENCOUNTER — Emergency Department (HOSPITAL_COMMUNITY)
Admission: EM | Admit: 2021-08-11 | Discharge: 2021-08-11 | Disposition: A | Discharge status: Home / Self Care | Payer: Medicare Other | Attending: Emergency Medicine | Admitting: Emergency Medicine

## 2021-08-11 DIAGNOSIS — R55 Syncope and collapse: Secondary | ICD-10-CM | POA: Diagnosis present

## 2021-08-11 DIAGNOSIS — Y9301 Activity, walking, marching and hiking: Secondary | ICD-10-CM | POA: Insufficient documentation

## 2021-08-11 DIAGNOSIS — Z79899 Other long term (current) drug therapy: Secondary | ICD-10-CM | POA: Insufficient documentation

## 2021-08-11 DIAGNOSIS — W228XXA Striking against or struck by other objects, initial encounter: Secondary | ICD-10-CM | POA: Insufficient documentation

## 2021-08-11 DIAGNOSIS — Y9289 Other specified places as the place of occurrence of the external cause: Secondary | ICD-10-CM | POA: Insufficient documentation

## 2021-08-11 DIAGNOSIS — R42 Dizziness and giddiness: Secondary | ICD-10-CM | POA: Insufficient documentation

## 2021-08-11 DIAGNOSIS — R251 Tremor, unspecified: Secondary | ICD-10-CM | POA: Insufficient documentation

## 2021-08-11 DIAGNOSIS — I10 Essential (primary) hypertension: Secondary | ICD-10-CM | POA: Diagnosis not present

## 2021-08-11 DIAGNOSIS — R4781 Slurred speech: Secondary | ICD-10-CM | POA: Diagnosis not present

## 2021-08-11 LAB — COMPREHENSIVE METABOLIC PANEL
ALT: 15 U/L (ref 0–44)
AST: 17 U/L (ref 15–41)
Albumin: 3.7 g/dL (ref 3.5–5.0)
Alkaline Phosphatase: 48 U/L (ref 38–126)
Anion gap: 8 (ref 5–15)
BUN: 13 mg/dL (ref 8–23)
CO2: 22 mmol/L (ref 22–32)
Calcium: 8.4 mg/dL — ABNORMAL LOW (ref 8.9–10.3)
Chloride: 110 mmol/L (ref 98–111)
Creatinine, Ser: 1.08 mg/dL (ref 0.61–1.24)
GFR, Estimated: >60 mL/min (ref >60)
Glucose, Bld: 99 mg/dL (ref 70–99)
Potassium: 4.3 mmol/L (ref 3.5–5.1)
Sodium: 140 mmol/L (ref 135–145)
Total Bilirubin: 1.1 mg/dL (ref 0.3–1.2)
Total Protein: 6.3 g/dL — ABNORMAL LOW (ref 6.5–8.1)

## 2021-08-11 LAB — CBC
HCT: 44.1 % (ref 39.0–52.0)
Hemoglobin: 13.8 g/dL (ref 13.0–17.0)
MCH: 29.4 pg (ref 26.0–34.0)
MCHC: 31.3 g/dL (ref 30.0–36.0)
MCV: 94 fL (ref 80.0–100.0)
Platelets: 260 10*3/uL (ref 150–400)
RBC: 4.69 MIL/uL (ref 4.22–5.81)
RDW: 14.5 % (ref 11.5–15.5)
WBC: 9.5 10*3/uL (ref 4.0–10.5)
nRBC: 0 % (ref 0.0–0.2)

## 2021-08-11 LAB — ETHANOL: Alcohol, Ethyl (B): <10 mg/dL (ref <10)

## 2021-08-11 MED ORDER — LACTATED RINGERS IV BOLUS
1000.0000 mL | Freq: Once | INTRAVENOUS | Status: AC
  Administered 2021-08-11: 1000 mL via INTRAVENOUS

## 2021-08-11 NOTE — ED Notes (Signed)
All discharge instructions reviewed with patient including follow up care. Patient verbalized understanding and had no other questions. Patient stable and ambulatory at time of discharge.  

## 2021-08-11 NOTE — ED Provider Notes (Incomplete)
Patient is a 66 year old male with a history of CKD, hypertension, tobacco, marijuana and occasional alcohol use who presents today after a syncopal event at the grocery store.  He reports he felt fine all morning and then when he was at the store he had prodromal symptoms of feeling hot, lightheaded and knew he was going to pass out.  It was reported that he lost consciousness and hit his head on the floor.  He denies any recent illness, any chest pain, palpitations or shortness of breath prior to the event or after he woke up.  He does take amlodipine and lisinopril and has been compliant with those medications which she has been on for quite some time.  He does report feeling lightheaded and having syncope in the past but reports it was a while ago.  He has been eating and drinking regularly and denies any reason why he would be dehydrated.  He has no abdominal pain or urinary symptoms.  No findings to suggest sepsis or infection.  I independently interpreted the patient's EKG which is within normal limits today without any evidence of dysrhythmia.  No prolonged QT, Brugada's or WPW present.

## 2021-08-11 NOTE — Discharge Instructions (Signed)
You were seen today after you passed out. Your lab work and EKG looked good. Please call your primary care doctor to schedule a close follow-up appointment as you might need further work-up of these episodes. If you develop any recurrence of your symptoms, headaches, dizziness, chest pain or trouble breathing, please return to the emergency room.

## 2021-08-11 NOTE — ED Notes (Signed)
Patient ambulated to the bathroom with a steady gait. No complaints of dizziness. This RN also provided patient with something to eat and drink at this time.

## 2021-08-11 NOTE — ED Provider Notes (Signed)
[name]   CSN: [id number] Arrival date & time: 08/11/21  1459     History  Chief Complaint  Patient presents with   Near Syncope    Patient arrives via EMS due to syncopal episode. Per EMS patient was at the store at had a syncopal episode in the aisle. Initially hypotensive with systolic BP in the 80s. EMS gave about 400 mL of NS and patient's BP on arrival 118/74.    Roy Chavez is a 66 y.o. male.  66 year old male with a past medical history of hypertension, hyperlipidemia, polysubstance use including alcohol abuse presents to the ED via EMS following a syncopal episode earlier this afternoon.  EMS states that the patient was walking at [employer] when he became lightheaded and lost consciousness. He reportedly did hit his head. No seizure-like activity was reported.  On EMS arrival, patient was diaphoretic and hypotensive with a blood pressure of 80/Palp which improved following 500 cc of fluids.  Point-of-care glucose 97.  Patient reports that he has a similar episode when "I get overheated".  He denies preceding chest pain, shortness of breath, headaches.  He denies any current symptoms including dizziness.  He states that he has been feeling well over the last several days and been eating well, states that he did eat this morning.  He states that he takes antihypertensives and anxiety medications which he took as prescribed this morning, denies additional doses or recent medication changes.  He denies alcohol or illicit drug use today.  The history is provided by the patient and the EMS personnel.       Home Medications Prior to Admission medications   Medication Sig Start Date End Date Taking? Authorizing Provider  amLODipine (NORVASC) 5 MG tablet TAKE 1 TABLET(5 MG) BY MOUTH DAILY 08/04/21   Storm Frisk, MD  atorvastatin (LIPITOR) 20 MG tablet Take 1 tablet (20 mg total) by mouth daily. 06/02/21   Storm Frisk,  MD  doxycycline (VIBRAMYCIN) 100 MG capsule Take 1 capsule (100 mg total) by mouth 2 (two) times daily. 05/12/21   Linwood Dibbles, MD  FLUoxetine (PROZAC) 20 MG capsule Take 1 capsule (20 mg total) by mouth daily. 06/02/21   Storm Frisk, MD  lisinopril (ZESTRIL) 20 MG tablet Take 1 tablet (20 mg total) by mouth daily. 06/02/21   Storm Frisk, MD  Multiple Vitamin (MULTIVITAMIN WITH MINERALS) TABS tablet Take 1 tablet by mouth daily. One A Day    [provider]      Allergies    Patient has no known allergies.    Review of Systems   Review of Systems  Constitutional:  Negative for fever.  Respiratory:  Negative for cough and shortness of breath.   Cardiovascular:  Negative for chest pain.  Gastrointestinal:  Negative for abdominal pain, nausea and vomiting.  Neurological:  Positive for syncope and light-headedness. Negative for weakness, numbness and headaches.    Physical Exam Updated Vital Signs BP (!) 126/92   Pulse 78   Temp 98 F (36.7 C) (Oral)   Resp 17   Ht 5\' 8"  (1.727 m)   Wt 74.8 kg   SpO2 97%   BMI 25.09 kg/m  Physical Exam Vitals and nursing note reviewed.  Constitutional:      General: He is not in acute distress.    Appearance: Normal appearance. He is well-developed.  HENT:     Head: Normocephalic.  Neck:  Comments: No midline cervical spine tenderness to palpation, no step-offs or deformities Cardiovascular:     Rate and Rhythm: Normal rate and regular rhythm.     Pulses: Normal pulses.     Heart sounds: No murmur heard. Pulmonary:     Effort: Pulmonary effort is normal. No respiratory distress.     Breath sounds: Normal breath sounds.  Abdominal:     General: There is no distension.     Palpations: Abdomen is soft.     Tenderness: There is no abdominal tenderness.  Musculoskeletal:     Cervical back: Neck supple.  Skin:    General: Skin is warm and dry.     Capillary Refill: Capillary refill takes less than 2 seconds.   Neurological:     Mental Status: He is alert.     Comments: Strength is 5/5 and equal to the proximal distal aspects of the bilateral upper and lower extremities.  Sensation is intact and symmetric to light touch bilaterally.  Mildly slurred speech but no overt dysarthria.  No facial asymmetry, cranial nerves are otherwise grossly intact.  Mild intention tremor noted on finger-to-nose, no tremor at rest.  Psychiatric:        Mood and Affect: Mood normal.     ED Results / Procedures / Treatments   Labs (all labs ordered are listed, but only abnormal results are displayed) Labs Reviewed  COMPREHENSIVE METABOLIC PANEL - Abnormal; Notable for the following components:      Result Value   Calcium 8.4 (*)    Total Protein 6.3 (*)    All other components within normal limits  CBC  ETHANOL    EKG EKG Interpretation  Date/Time:  Thursday August 11 2021 15:25:20 EDT Ventricular Rate:  71 PR Interval:  207 QRS Duration: 80 QT Interval:  398 QTC Calculation: 433 R Axis:   69 Text Interpretation: Sinus rhythm No significant change since last tracing Confirmed by Gwyneth Sprout ([id number]) on 08/11/2021 3:33:12 PM  Radiology CT Head Wo Contrast  Result Date: 08/11/2021 CLINICAL DATA:  Head trauma EXAM: CT HEAD WITHOUT CONTRAST TECHNIQUE: Contiguous axial images were obtained from the base of the skull through the vertex without intravenous contrast. RADIATION DOSE REDUCTION: This exam was performed according to the departmental dose-optimization program which includes automated exposure control, adjustment of the mA and/or kV according to patient size and/or use of iterative reconstruction technique. COMPARISON:  01/21/2021 FINDINGS: Brain: No acute intracranial hemorrhage, infarction, mass lesion, midline shift, herniation, hydrocephalus, or extra-axial fluid collection. Minor scattered white matter microvascular ischemic changes throughout both cerebral hemispheres. No focal mass effect or edema.  Cisterns are patent. No cerebellar abnormality. Vascular: No hyperdense vessel or unexpected calcification. Skull: Normal. Negative for fracture or focal lesion. Sinuses/Orbits: Orbits and sinuses are clear. Mastoid effusions noted bilaterally. Other: None. IMPRESSION: Stable exam.  No acute intracranial abnormality by noncontrast CT. Bilateral mastoid effusions. Electronically Signed   By: Judie Petit.  Shick M.D.   On: 08/11/2021 16:38    Procedures Procedures    Medications Ordered in ED Medications  lactated ringers bolus 1,000 mL (0 mLs Intravenous Stopped 08/11/21 1741)    ED Course/ Medical Decision Making/ A&P                           Medical Decision Making Amount and/or Complexity of Data Reviewed Labs: ordered. Radiology: ordered.   66 year old male with a past medical history of hypertension and polysubstance use presents following a  witnessed syncopal episode.  I personally reviewed the patient's chart which is notable for similar episodes in the past, most recently November 2022. Many of these episodes have been attributed to patient's substance use and dehydration.  On exam today, patient is well-appearing with a reassuring neurologic exam except for a slight intention tremor which is symmetric bilaterally.  His EKG appears nonischemic with appropriate intervals, no evidence of high degree heart block or intraventricular conduction delay.  No WPW or Brugada patterns.  Lower suspicion for cardiogenic etiology at this time.  No reported seizure-like activity by bystanders.   Lab work was obtained which I reviewed and interpreted.  CBC without evidence of leukocytosis or leukopenia, no anemia.  CMP without significant electrolyte abnormalities, no evidence of renal or liver dysfunction.  Normal anion gap.  Additionally obtained a CT head given the reported trauma which was negative for intracranial hemorrhage or skull fractures.  On multiple reevaluations, patient's mental status and  hemodynamics remained stable.  Given the reported diaphoresis and hypotension prior to arrival, suspect likely vagal vs orthostatic component.  Patient was ambulatory in the ED under his own power without difficulty or recurrence of symptoms.  Results and plan of care were discussed with the patient who verbalized understanding.  I advised patient that he will need to follow-up closely with his primary care provider within the next several weeks given his recurrent syncopal episodes.  I additionally gave strict ED return precautions including any recurrence of his syncope, neurologic symptoms or chest pain.  At this time, patient is stable for discharge home.  Final Clinical Impression(s) / ED Diagnoses Final diagnoses:  Syncope and collapse    Rx / DC Orders ED Discharge Orders     None         Ripley Lovecchio, [place], MD 08/11/21 Wray [id number]    Plunkett, Whitney, MD 08/12/21 [phone]

## 2021-08-30 ENCOUNTER — Other Ambulatory Visit: Payer: Self-pay | Admitting: *Deleted

## 2021-08-30 DIAGNOSIS — F17210 Nicotine dependence, cigarettes, uncomplicated: Secondary | ICD-10-CM

## 2021-08-30 DIAGNOSIS — Z122 Encounter for screening for malignant neoplasm of respiratory organs: Secondary | ICD-10-CM

## 2021-08-30 DIAGNOSIS — Z87891 Personal history of nicotine dependence: Secondary | ICD-10-CM

## 2021-09-20 ENCOUNTER — Telehealth: Payer: Self-pay | Admitting: Acute Care

## 2021-09-20 ENCOUNTER — Encounter: Payer: Medicare Other | Admitting: Acute Care

## 2021-09-20 ENCOUNTER — Ambulatory Visit: Admission: RE | Admit: 2021-09-20 | Payer: Medicare Other | Source: Ambulatory Visit

## 2021-09-20 NOTE — Telephone Encounter (Signed)
I have called the patient multiple times for his telephone visit 09/20/2021. The phone connects to a continuous busy signal. Pt. Has a CT scan scheduled for tonight at 4 pm, which we will cancel, as he must have the telephone visit first. We have been unable to leave him a message, as he does not answer his phone.  He will need to be rescheduled

## 2021-10-03 NOTE — Telephone Encounter (Signed)
We will contact pt to get him reschedule for [facility] and LDCT.

## 2021-10-04 ENCOUNTER — Other Ambulatory Visit: Payer: Self-pay | Admitting: Critical Care Medicine

## 2021-11-11 ENCOUNTER — Ambulatory Visit
Admission: RE | Admit: 2021-11-11 | Discharge: 2021-11-11 | Disposition: A | Discharge status: Home / Self Care | Payer: Medicare Other | Source: Ambulatory Visit | Attending: Family Medicine | Admitting: Family Medicine

## 2021-11-11 ENCOUNTER — Other Ambulatory Visit: Payer: Self-pay | Admitting: Family Medicine

## 2021-11-11 DIAGNOSIS — M25512 Pain in left shoulder: Secondary | ICD-10-CM

## 2021-12-02 ENCOUNTER — Ambulatory Visit
Admission: RE | Admit: 2021-12-02 | Discharge: 2021-12-02 | Disposition: A | Discharge status: Home / Self Care | Payer: Medicare Other | Source: Ambulatory Visit | Attending: Acute Care | Admitting: Acute Care

## 2021-12-02 ENCOUNTER — Telehealth: Payer: Self-pay | Admitting: Acute Care

## 2021-12-02 DIAGNOSIS — Z87891 Personal history of nicotine dependence: Secondary | ICD-10-CM

## 2021-12-02 DIAGNOSIS — Z122 Encounter for screening for malignant neoplasm of respiratory organs: Secondary | ICD-10-CM

## 2021-12-02 DIAGNOSIS — F17210 Nicotine dependence, cigarettes, uncomplicated: Secondary | ICD-10-CM

## 2021-12-02 NOTE — Telephone Encounter (Signed)
Pt called office stating he was needing to schedule next appt for Sarah in lung cancer screening program. Routing to lung nodule pool for them to follow up on.

## 2021-12-02 NOTE — Telephone Encounter (Signed)
Spoke with patient ReScheduled SDMV 12/05/21 11:00 CT rescheduled 12/19/21 1:00 Pt voiced understanding and had no further questions.

## 2021-12-05 ENCOUNTER — Encounter: Payer: Self-pay | Admitting: Pulmonary Disease

## 2021-12-05 ENCOUNTER — Ambulatory Visit (INDEPENDENT_AMBULATORY_CARE_PROVIDER_SITE_OTHER): Payer: Medicare Other | Admitting: Pulmonary Disease

## 2021-12-05 DIAGNOSIS — Z122 Encounter for screening for malignant neoplasm of respiratory organs: Secondary | ICD-10-CM

## 2021-12-05 DIAGNOSIS — Z87891 Personal history of nicotine dependence: Secondary | ICD-10-CM

## 2021-12-05 NOTE — Progress Notes (Signed)
Shared Decision Making Visit Lung Cancer Screening Program [phone])   Eligibility: Age 66 y.o. Pack Years Smoking History Calculation 43 (# packs/per year x # years smoked) Recent History of coughing up blood  no Unexplained weight loss? no ( >Than 15 pounds within the last 6 months ) Prior History Lung / other cancer no (Diagnosis within the last 5 years already requiring surveillance chest CT Scans). Smoking Status Current Smoker 1 ppd   Visit Components: Discussion included one or more decision making aids. yes Discussion included risk/benefits of screening. yes Discussion included potential follow up diagnostic testing for abnormal scans. yes Discussion included meaning and risk of over diagnosis. yes Discussion included meaning and risk of False Positives. yes Discussion included meaning of total radiation exposure. yes  Counseling Included: Importance of adherence to annual lung cancer LDCT screening. yes Impact of comorbidities on ability to participate in the program. yes Ability and willingness to under diagnostic treatment. yes  Smoking Cessation Counseling: Current Smokers:  Discussed importance of smoking cessation. yes Information about tobacco cessation classes and interventions provided to patient. yes Patient provided with "ticket" for LDCT Scan. yes Asymptomatic Patient yes  Counseling (Intermediate counseling: > three minutes counseling) [id number]  Pt declines to stop smoking at this time. Discussed need to and how it can improve health.  Information about tobacco cessation classes and interventions provided to patient. Yes Patient provided with "ticket" for LDCT Scan. yes Written Order for Lung Cancer Screening with LDCT placed in Epic. Yes (CT Chest Lung Cancer Screening Low Dose W/O CM) [id number] Z12.2-Screening of respiratory organs Z87.891-Personal history of nicotine dependence   Lauraine Rinne, NP

## 2021-12-05 NOTE — Patient Instructions (Signed)
Thank you for participating in the [place] [facility] Cancer Screening Program. It was our pleasure to meet you today. We will call you with the results of your scan within the next few days. Your scan will be assigned a Lung RADS category score by the physicians reading the scans.  This Lung RADS score determines follow up scanning.  See below for description of categories, and follow up screening recommendations. We will be in touch to schedule your follow up screening annually or based on recommendations of our providers. We will fax a copy of your scan results to your Primary Care Physician, or the physician who referred you to the program, to ensure they have the results. Please call the office if you have any questions or concerns regarding your scanning experience or results.  Our office number is [phone]. Please speak with Denise Phelps, RN. , or  Denise Buckner RN, They are  our Lung Cancer Screening RN.'s If They are unavailable when you call, Please leave a message on the voice mail. We will return your call at our earliest convenience.This voice mail is monitored several times a day.  Remember, if your scan is normal, we will scan you annually as long as you continue to meet the criteria for the program. (Age 55-77, Current smoker or smoker who has quit within the last 15 years). If you are a smoker, remember, quitting is the single most powerful action that you can take to decrease your risk of lung cancer and other pulmonary, breathing related problems. We know quitting is hard, and we are here to help.  Please let us know if there is anything we can do to help you meet your goal of quitting. If you are a former smoker, congratulations. We are proud of you! Remain smoke free! Remember you can refer friends or family members through the number above.  We will screen them to make sure they meet criteria for the program. Thank you for helping us take better care of you by  participating in Lung Screening.  You can receive free nicotine replacement therapy ( patches, gum or mints) by calling 1-800-QUIT NOW. Please call so we can get you on the path to becoming  a non-smoker. I know it is hard, but you can do this!  Lung RADS Categories:  Lung RADS 1: no nodules or definitely non-concerning nodules.  Recommendation is for a repeat annual scan in 12 months.  Lung RADS 2:  nodules that are non-concerning in appearance and behavior with a very low likelihood of becoming an active cancer. Recommendation is for a repeat annual scan in 12 months.  Lung RADS 3: nodules that are probably non-concerning , includes nodules with a low likelihood of becoming an active cancer.  Recommendation is for a 6-month repeat screening scan. Often noted after an upper respiratory illness. We will be in touch to make sure you have no questions, and to schedule your 6-month scan.  Lung RADS 4 A: nodules with concerning findings, recommendation is most often for a follow up scan in 3 months or additional testing based on our provider's assessment of the scan. We will be in touch to make sure you have no questions and to schedule the recommended 3 month follow up scan.  Lung RADS 4 B:  indicates findings that are concerning. We will be in touch with you to schedule additional diagnostic testing based on our provider's  assessment of the scan.  Other options for assistance in smoking cessation (   As covered by your insurance benefits)  Hypnosis for smoking cessation  [employer] [phone]  Acupuncture for smoking cessation  [facility] [phone]   

## 2021-12-15 ENCOUNTER — Other Ambulatory Visit: Payer: Self-pay | Admitting: *Deleted

## 2021-12-15 DIAGNOSIS — Z87891 Personal history of nicotine dependence: Secondary | ICD-10-CM

## 2021-12-15 DIAGNOSIS — F17210 Nicotine dependence, cigarettes, uncomplicated: Secondary | ICD-10-CM

## 2021-12-15 DIAGNOSIS — Z122 Encounter for screening for malignant neoplasm of respiratory organs: Secondary | ICD-10-CM

## 2021-12-19 ENCOUNTER — Ambulatory Visit
Admission: RE | Admit: 2021-12-19 | Discharge: 2021-12-19 | Disposition: A | Discharge status: Home / Self Care | Payer: Medicare Other | Source: Ambulatory Visit | Attending: Acute Care | Admitting: Acute Care

## 2021-12-19 DIAGNOSIS — Z87891 Personal history of nicotine dependence: Secondary | ICD-10-CM

## 2021-12-19 DIAGNOSIS — Z122 Encounter for screening for malignant neoplasm of respiratory organs: Secondary | ICD-10-CM

## 2021-12-19 DIAGNOSIS — F17210 Nicotine dependence, cigarettes, uncomplicated: Secondary | ICD-10-CM

## 2021-12-21 ENCOUNTER — Other Ambulatory Visit: Payer: Self-pay

## 2021-12-21 DIAGNOSIS — Z87891 Personal history of nicotine dependence: Secondary | ICD-10-CM

## 2021-12-21 DIAGNOSIS — F17210 Nicotine dependence, cigarettes, uncomplicated: Secondary | ICD-10-CM

## 2021-12-21 DIAGNOSIS — Z122 Encounter for screening for malignant neoplasm of respiratory organs: Secondary | ICD-10-CM

## 2021-12-21 LAB — EXTERNAL GENERIC LAB PROCEDURE

## 2021-12-21 LAB — COLOGUARD

## 2022-03-24 LAB — COLOGUARD

## 2022-03-24 LAB — EXTERNAL GENERIC LAB PROCEDURE

## 2022-05-05 LAB — COLOGUARD: COLOGUARD: NEGATIVE

## 2022-05-05 LAB — EXTERNAL GENERIC LAB PROCEDURE: COLOGUARD: NEGATIVE

## 2022-12-21 ENCOUNTER — Inpatient Hospital Stay: Admission: RE | Admit: 2022-12-21 | Payer: Medicare Other | Source: Ambulatory Visit

## 2023-07-04 ENCOUNTER — Other Ambulatory Visit: Payer: Self-pay | Admitting: Family Medicine

## 2023-07-04 DIAGNOSIS — F17200 Nicotine dependence, unspecified, uncomplicated: Secondary | ICD-10-CM

## 2023-07-04 DIAGNOSIS — Z122 Encounter for screening for malignant neoplasm of respiratory organs: Secondary | ICD-10-CM

## 2023-07-16 ENCOUNTER — Encounter: Payer: Self-pay | Admitting: Family Medicine

## 2023-07-19 ENCOUNTER — Ambulatory Visit
Admission: RE | Admit: 2023-07-19 | Discharge: 2023-07-19 | Disposition: A | Discharge status: Home / Self Care | Source: Ambulatory Visit | Attending: Family Medicine | Admitting: Family Medicine

## 2023-07-19 DIAGNOSIS — Z122 Encounter for screening for malignant neoplasm of respiratory organs: Secondary | ICD-10-CM

## 2023-07-19 DIAGNOSIS — F17200 Nicotine dependence, unspecified, uncomplicated: Secondary | ICD-10-CM

## 2023-12-29 ENCOUNTER — Other Ambulatory Visit: Payer: Self-pay

## 2023-12-29 ENCOUNTER — Observation Stay (HOSPITAL_COMMUNITY)
Admission: EM | Admit: 2023-12-29 | Discharge: 2023-12-31 | Disposition: A | Discharge status: Home / Self Care | Attending: Internal Medicine | Admitting: Internal Medicine

## 2023-12-29 ENCOUNTER — Emergency Department (HOSPITAL_COMMUNITY)

## 2023-12-29 DIAGNOSIS — Z720 Tobacco use: Secondary | ICD-10-CM

## 2023-12-29 DIAGNOSIS — R55 Syncope and collapse: Principal | ICD-10-CM | POA: Diagnosis present

## 2023-12-29 DIAGNOSIS — E785 Hyperlipidemia, unspecified: Secondary | ICD-10-CM | POA: Insufficient documentation

## 2023-12-29 DIAGNOSIS — S0003XA Contusion of scalp, initial encounter: Secondary | ICD-10-CM | POA: Insufficient documentation

## 2023-12-29 DIAGNOSIS — J90 Pleural effusion, not elsewhere classified: Secondary | ICD-10-CM | POA: Insufficient documentation

## 2023-12-29 DIAGNOSIS — F1910 Other psychoactive substance abuse, uncomplicated: Secondary | ICD-10-CM | POA: Insufficient documentation

## 2023-12-29 DIAGNOSIS — I129 Hypertensive chronic kidney disease with stage 1 through stage 4 chronic kidney disease, or unspecified chronic kidney disease: Secondary | ICD-10-CM | POA: Insufficient documentation

## 2023-12-29 DIAGNOSIS — N183 Chronic kidney disease, stage 3 unspecified: Secondary | ICD-10-CM | POA: Insufficient documentation

## 2023-12-29 DIAGNOSIS — F32A Depression, unspecified: Secondary | ICD-10-CM | POA: Insufficient documentation

## 2023-12-29 DIAGNOSIS — X58XXXA Exposure to other specified factors, initial encounter: Secondary | ICD-10-CM | POA: Insufficient documentation

## 2023-12-29 LAB — URINALYSIS, ROUTINE W REFLEX MICROSCOPIC
Bilirubin Urine: NEGATIVE
Glucose, UA: NEGATIVE mg/dL
Hgb urine dipstick: NEGATIVE
Ketones, ur: NEGATIVE mg/dL
Leukocytes,Ua: NEGATIVE
Nitrite: NEGATIVE
Protein, ur: NEGATIVE mg/dL
Specific Gravity, Urine: >1.046 — ABNORMAL HIGH (ref 1.005–1.030)
pH: 6 (ref 5.0–8.0)

## 2023-12-29 LAB — COMPREHENSIVE METABOLIC PANEL WITH GFR
ALT: 15 U/L (ref 0–44)
AST: 21 U/L (ref 15–41)
Albumin: 4 g/dL (ref 3.5–5.0)
Alkaline Phosphatase: 46 U/L (ref 38–126)
Anion gap: 12 (ref 5–15)
BUN: 16 mg/dL (ref 8–23)
CO2: 22 mmol/L (ref 22–32)
Calcium: 9.3 mg/dL (ref 8.9–10.3)
Chloride: 106 mmol/L (ref 98–111)
Creatinine, Ser: 1.25 mg/dL — ABNORMAL HIGH (ref 0.61–1.24)
GFR, Estimated: >60 mL/min (ref >60)
Glucose, Bld: 98 mg/dL (ref 70–99)
Potassium: 4.1 mmol/L (ref 3.5–5.1)
Sodium: 140 mmol/L (ref 135–145)
Total Bilirubin: 0.8 mg/dL (ref 0.0–1.2)
Total Protein: 6.6 g/dL (ref 6.5–8.1)

## 2023-12-29 LAB — RAPID URINE DRUG SCREEN, HOSP PERFORMED
Amphetamines: NOT DETECTED
Barbiturates: NOT DETECTED
Benzodiazepines: NOT DETECTED
Cocaine: POSITIVE — AB
Opiates: NOT DETECTED
Tetrahydrocannabinol: POSITIVE — AB

## 2023-12-29 LAB — CBC
HCT: 39.8 % (ref 39.0–52.0)
Hemoglobin: 13.3 g/dL (ref 13.0–17.0)
MCH: 30.7 pg (ref 26.0–34.0)
MCHC: 33.4 g/dL (ref 30.0–36.0)
MCV: 91.9 fL (ref 80.0–100.0)
Platelets: 240 K/uL (ref 150–400)
RBC: 4.33 MIL/uL (ref 4.22–5.81)
RDW: 15 % (ref 11.5–15.5)
WBC: 9.7 K/uL (ref 4.0–10.5)
nRBC: 0 % (ref 0.0–0.2)

## 2023-12-29 LAB — LIPASE, BLOOD: Lipase: 32 U/L (ref 11–51)

## 2023-12-29 LAB — ETHANOL: Alcohol, Ethyl (B): <15 mg/dL (ref <15)

## 2023-12-29 LAB — CBG MONITORING, ED: Glucose-Capillary: 97 mg/dL (ref 70–99)

## 2023-12-29 LAB — TROPONIN I (HIGH SENSITIVITY)
Troponin I (High Sensitivity): 8 ng/L (ref <18)
Troponin I (High Sensitivity): 9 ng/L (ref <18)

## 2023-12-29 MED ORDER — IOHEXOL 350 MG/ML SOLN
75.0000 mL | Freq: Once | INTRAVENOUS | Status: AC | PRN
  Administered 2023-12-29: 75 mL via INTRAVENOUS

## 2023-12-29 MED ORDER — ONDANSETRON HCL 4 MG/2ML IJ SOLN
4.0000 mg | Freq: Once | INTRAMUSCULAR | Status: AC
  Administered 2023-12-29: 4 mg via INTRAVENOUS
  Filled 2023-12-29: qty 2

## 2023-12-29 MED ORDER — LACTATED RINGERS IV BOLUS
1000.0000 mL | Freq: Once | INTRAVENOUS | Status: AC
  Administered 2023-12-29: 1000 mL via INTRAVENOUS

## 2023-12-29 NOTE — ED Triage Notes (Signed)
 Patient was waiting in line at [employer] when patient had a syncopal episode falling to the floor. Initial blood pressure on scene was 78/40. With EMS patient was alert and oriented x4, complaining of abdominal pain. Patient received 250ml of fluids before IV was ripped out. Hx of HTN EMS VS 118 SBP 131 CBG 50s HR

## 2023-12-29 NOTE — ED Notes (Signed)
 Lab called stating that patient's CBC hemolyzed. Lab tech stated they will be putting in the order for recollection.

## 2023-12-29 NOTE — ED Notes (Signed)
 Patient transported to CT

## 2023-12-29 NOTE — ED Notes (Signed)
 Patient returned from CT

## 2023-12-29 NOTE — ED Provider Notes (Signed)
 [place] EMERGENCY DEPARTMENT AT [facility] Provider Note   CSN: [id number] Arrival date & time: 12/29/23  1918     Patient presents with: Loss of Consciousness, Abdominal Pain, and Hypotension   Roy Chavez is a 68 y.o. male.   HPI 68 year old male presents with syncope.  He was reportedly in line at [employer] and had a syncopal episode.  The patient states that he remembers standing in line and started to feel hot and lightheaded and then does not remember what happened.  He woke up and had abdominal pain.  He had some chest pain as well but this went away.  He denies headache or neck/back pain.  Unfortunately, the patient is speaking quite softly and is a poor historian.  He is saying something about how he had candy before he took his medicines this evening and thinks that is what caused the episode.  No palpitations tonight.  Prior to Admission medications   Medication Sig Start Date End Date Taking? Authorizing Provider  amLODipine  (NORVASC ) 5 MG tablet TAKE 1 TABLET(5 MG) BY MOUTH DAILY 08/04/21   Brien Belvie BRAVO, MD  atorvastatin  (LIPITOR) 20 MG tablet Take 1 tablet (20 mg total) by mouth daily. 06/02/21   Brien Belvie BRAVO, MD  doxycycline  (VIBRAMYCIN ) 100 MG capsule Take 1 capsule (100 mg total) by mouth 2 (two) times daily. 05/12/21   Randol Simmonds, MD  FLUoxetine  (PROZAC ) 20 MG capsule Take 1 capsule (20 mg total) by mouth daily. 06/02/21   Brien Belvie BRAVO, MD  lisinopril  (ZESTRIL ) 20 MG tablet Take 1 tablet (20 mg total) by mouth daily. 06/02/21   Brien Belvie BRAVO, MD  Multiple Vitamin (MULTIVITAMIN WITH MINERALS) TABS tablet Take 1 tablet by mouth daily. One A Day    [provider]    Allergies: Patient has no known allergies.    Review of Systems  Cardiovascular:  Positive for chest pain. Negative for palpitations.  Gastrointestinal:  Positive for abdominal pain.  Musculoskeletal:  Negative for back pain and neck pain.  Neurological:  Positive  for syncope and light-headedness. Negative for headaches.    Updated Vital Signs BP 121/85   Pulse 82   Temp 97.7 F (36.5 C) (Oral)   Resp 19   SpO2 99%   Physical Exam Vitals and nursing note reviewed.  Constitutional:      General: He is not in acute distress.    Appearance: He is well-developed. He is not ill-appearing or diaphoretic.  HENT:     Head: Normocephalic and atraumatic.     Mouth/Throat:     Comments: No obvious tongue biting injury Eyes:     Pupils: Pupils are equal, round, and reactive to light.  Cardiovascular:     Rate and Rhythm: Normal rate and regular rhythm.     Heart sounds: Normal heart sounds.  Pulmonary:     Effort: Pulmonary effort is normal.     Breath sounds: Normal breath sounds.  Abdominal:     Palpations: Abdomen is soft.     Tenderness: There is generalized abdominal tenderness.  Skin:    General: Skin is warm and dry.  Neurological:     Mental Status: He is alert.     Comments: Patient is awake but a poor historian.  Often looks away from me while talking.  Has equal strength but very poor effort in all 4 extremities.     (all labs ordered are listed, but only abnormal results are displayed) Labs Reviewed  COMPREHENSIVE METABOLIC PANEL WITH GFR - Abnormal; Notable for the following components:      Result Value   Creatinine, Ser 1.25 (*)    All other components within normal limits  URINALYSIS, ROUTINE W REFLEX MICROSCOPIC - Abnormal; Notable for the following components:   Specific Gravity, Urine >1.046 (*)    All other components within normal limits  RAPID URINE DRUG SCREEN, HOSP PERFORMED - Abnormal; Notable for the following components:   Cocaine POSITIVE (*)    Tetrahydrocannabinol POSITIVE (*)    All other components within normal limits  LIPASE, BLOOD  ETHANOL  CBC  CBG MONITORING, ED  TROPONIN I (HIGH SENSITIVITY)  TROPONIN I (HIGH SENSITIVITY)    EKG: EKG Interpretation Date/Time:  Saturday December 29 2023  19:22:33 EDT Ventricular Rate:  63 PR Interval:  204 QRS Duration:  85 QT Interval:  419 QTC Calculation: 429 R Axis:   85  Text Interpretation: Sinus arrhythmia Anteroseptal infarct, age indeterminate Confirmed by Freddi Hamilton [phone]) on 12/29/2023 10:01:52 PM  Radiology: CT Head Wo Contrast Result Date: 12/29/2023 EXAM: CT HEAD AND CERVICAL SPINE 12/29/2023 09:03:05 PM TECHNIQUE: CT of the head and cervical spine was performed without the administration of intravenous contrast. Multiplanar reformatted images are provided for review. Automated exposure control, iterative reconstruction, and/or weight based adjustment of the mA/kV was utilized to reduce the radiation dose to as low as reasonably achievable. COMPARISON: None available. CLINICAL HISTORY: Polytrauma, blunt. Loss of Consciousness; Abdominal Pain; Hypotension FINDINGS: CT HEAD BRAIN AND VENTRICLES: No acute intracranial hemorrhage. No mass effect or midline shift. No abnormal extra-axial fluid collection. No evidence of acute infarct. No hydrocephalus. Patchy white matter changes, nonspecific but compatible with chronic microvascular ischemic change. ORBITS: No acute abnormality. SINUSES AND MASTOIDS: Tonsils. Left mastoid effusion and middle ear fluid. Remote nasal bone fracture. SOFT TISSUES AND SKULL: No acute skull fracture. Vertical scalp contusion. CT CERVICAL SPINE BONES AND ALIGNMENT: No acute fracture or traumatic malalignment. 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 Multilevel facet and uncovertebral hypertrophy with varying degrees of neural foraminal stenosis. SOFT TISSUES: No prevertebral soft tissue swelling. IMPRESSION: 1. No acute intracranial abnormality. 2. No acute fracture or traumatic malalignment of the cervical spine. 3. Vertical scalp contusion. 4. Left mastoid effusion and middle ear fluid. Electronically  signed by: Gilmore Molt MD 12/29/2023 09:23 PM EDT RP Workstation: [id number]   CT Cervical Spine Wo Contrast Result Date: 12/29/2023 EXAM: CT HEAD AND CERVICAL SPINE 12/29/2023 09:03:05 PM TECHNIQUE: CT of the head and cervical spine was performed without the administration of intravenous contrast. Multiplanar reformatted images are provided for review. Automated exposure control, iterative reconstruction, and/or weight based adjustment of the mA/kV was utilized to reduce the radiation dose to as low as reasonably achievable. COMPARISON: None available. CLINICAL HISTORY: Polytrauma, blunt. Loss of Consciousness; Abdominal Pain; Hypotension FINDINGS: CT HEAD BRAIN AND VENTRICLES: No acute intracranial hemorrhage. No mass effect or midline shift. No abnormal extra-axial fluid collection. No evidence of acute infarct. No hydrocephalus. Patchy white matter changes, nonspecific but compatible with chronic microvascular ischemic change. ORBITS: No acute abnormality. SINUSES AND MASTOIDS: Tonsils. Left mastoid effusion and middle ear fluid. Remote nasal bone fracture. SOFT TISSUES AND SKULL: No acute skull fracture. Vertical scalp contusion. CT CERVICAL SPINE BONES AND ALIGNMENT: No acute fracture or traumatic malalignment. 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 Multilevel facet and uncovertebral  hypertrophy with varying degrees of neural foraminal stenosis. SOFT TISSUES: No prevertebral soft tissue swelling. IMPRESSION: 1. No acute intracranial abnormality. 2. No acute fracture or traumatic malalignment of the cervical spine. 3. Vertical scalp contusion. 4. Left mastoid effusion and middle ear fluid. Electronically signed by: Gilmore Molt MD 12/29/2023 09:23 PM EDT RP Workstation: [id number]   CT CHEST ABDOMEN PELVIS W CONTRAST Result Date: 12/29/2023 EXAM: CT CHEST, ABDOMEN AND PELVIS WITH CONTRAST  12/29/2023 09:03:05 PM TECHNIQUE: CT of the chest, abdomen and pelvis was performed with the administration of intravenous contrast. Multiplanar reformatted images are provided for review. Automated exposure control, iterative reconstruction, and/or weight based adjustment of the mA/kV was utilized to reduce the radiation dose to as low as reasonably achievable. COMPARISON: CT chest dated 07/19/2023. CLINICAL HISTORY: Polytrauma, blunt. Loss of Consciousness; Abdominal Pain; Hypotension ; 75ml Omni350. FINDINGS: CHEST: MEDIASTINUM AND LYMPH NODES: Heart and pericardium are unremarkable. The central airways are clear. No mediastinal, hilar or axillary lymphadenopathy. LUNGS AND PLEURA: No focal consolidation or pulmonary edema. No pleural effusion or pneumothorax. ABDOMEN AND PELVIS: LIVER: 2.0 cm posterior right hepatic hemangioma, benign. Additional probable hemangioma in the posterior right hepatic dome (image 56). GALLBLADDER AND BILE DUCTS: Gallbladder is unremarkable. No biliary ductal dilatation. SPLEEN: No acute abnormality. PANCREAS: No acute abnormality. ADRENAL GLANDS: No acute abnormality. KIDNEYS, URETERS AND BLADDER: No stones in the kidneys or ureters. No hydronephrosis. No perinephric or periureteral stranding. Urinary bladder is unremarkable. GI AND BOWEL: Stomach demonstrates no acute abnormality. There is no bowel obstruction. REPRODUCTIVE ORGANS: Prostate is unremarkable. PERITONEUM AND RETROPERITONEUM: No ascites. No free air. VASCULATURE: Aorta is normal in caliber. ABDOMINAL AND PELVIS LYMPH NODES: No lymphadenopathy. BONES AND SOFT TISSUES: Mild degenerative changes at L5-S1. No acute osseous abnormality. No focal soft tissue abnormality. IMPRESSION: 1. No acute abnormality of the chest, abdomen, or pelvis. Electronically signed by: Pinkie Pebbles MD 12/29/2023 09:23 PM EDT RP Workstation: [id number]   DG Chest Portable 1 View Result Date: 12/29/2023 CLINICAL DATA:  Syncope. EXAM:  PORTABLE CHEST 1 VIEW COMPARISON:  01/21/2021 FINDINGS: The cardiomediastinal contours are stable. The lungs are clear. Pulmonary vasculature is normal. No consolidation, pleural effusion, or pneumothorax. No acute osseous abnormalities are seen. IMPRESSION: No active disease. Electronically Signed   By: Andrea Gasman M.D.   On: 12/29/2023 20:45     Procedures   Medications Ordered in the ED  lactated ringers  bolus 1,000 mL (0 mLs Intravenous Stopped 12/29/23 2204)  iohexol (OMNIPAQUE) 350 MG/ML injection 75 mL (75 mLs Intravenous Contrast Given 12/29/23 2103)  ondansetron  (ZOFRAN ) injection 4 mg (4 mg Intravenous Given 12/29/23 2214)                                    Medical Decision Making Amount and/or Complexity of Data Reviewed Labs: ordered.    Details: Normal troponin Radiology: ordered and independent interpretation performed.    Details: No head bleed ECG/medicine tests: ordered and independent interpretation performed.    Details: Sinus arrhythmia  Risk Prescription drug management. Decision regarding hospitalization.   Patient presents with syncope.  Unclear exact cause though there was minimal prodrome while he was standing in line at the grocery store.  He has an odd affect here and is a poor historian.  Has some abdominal discomfort though workup is so far otherwise unremarkable.  Does have some significant arrhythmia though sinus on the monitor.  At times he will  slow down briefly but no obvious pathologic arrhythmia on the cardiac monitoring.  However with age and reported history of prior CHF, I think you will need admission for monitoring and CHF workup. Discussed with Dr. Dena.     Final diagnoses:  Syncope and collapse    ED Discharge Orders     None          Freddi Hamilton, MD 12/29/23 2313

## 2023-12-29 NOTE — ED Notes (Addendum)
 This nurse attempted an IV, due to patient movement. All attempts with unsuccessful.

## 2023-12-30 ENCOUNTER — Encounter (HOSPITAL_COMMUNITY): Payer: Self-pay | Admitting: Internal Medicine

## 2023-12-30 ENCOUNTER — Observation Stay (HOSPITAL_BASED_OUTPATIENT_CLINIC_OR_DEPARTMENT_OTHER)

## 2023-12-30 DIAGNOSIS — R55 Syncope and collapse: Secondary | ICD-10-CM | POA: Diagnosis not present

## 2023-12-30 LAB — ECHOCARDIOGRAM COMPLETE
Area-P 1/2: 3.93 cm2
S' Lateral: 2.9 cm

## 2023-12-30 MED ORDER — AMLODIPINE BESYLATE 5 MG PO TABS
5.0000 mg | ORAL_TABLET | Freq: Every day | ORAL | Status: DC
  Administered 2023-12-30 – 2023-12-31 (×2): 5 mg via ORAL
  Filled 2023-12-30 (×2): qty 1

## 2023-12-30 MED ORDER — ONDANSETRON HCL 4 MG PO TABS: 4.0000 mg | ORAL_TABLET | Freq: Four times a day (QID) | ORAL | Status: DC | PRN

## 2023-12-30 MED ORDER — ENOXAPARIN SODIUM 40 MG/0.4ML IJ SOSY
40.0000 mg | PREFILLED_SYRINGE | INTRAMUSCULAR | Status: DC
  Administered 2023-12-30 – 2023-12-31 (×2): 40 mg via SUBCUTANEOUS
  Filled 2023-12-30 (×2): qty 0.4

## 2023-12-30 MED ORDER — ATORVASTATIN CALCIUM 10 MG PO TABS
20.0000 mg | ORAL_TABLET | Freq: Every day | ORAL | Status: DC
  Administered 2023-12-30 – 2023-12-31 (×2): 20 mg via ORAL
  Filled 2023-12-30: qty 1
  Filled 2023-12-30: qty 2

## 2023-12-30 MED ORDER — ACETAMINOPHEN 325 MG PO TABS
650.0000 mg | ORAL_TABLET | Freq: Four times a day (QID) | ORAL | Status: DC | PRN
  Administered 2023-12-30: 650 mg via ORAL
  Filled 2023-12-30: qty 2

## 2023-12-30 MED ORDER — LISINOPRIL 20 MG PO TABS
20.0000 mg | ORAL_TABLET | Freq: Every day | ORAL | Status: DC
  Administered 2023-12-30 – 2023-12-31 (×2): 20 mg via ORAL
  Filled 2023-12-30 (×2): qty 1

## 2023-12-30 MED ORDER — ONDANSETRON HCL 4 MG/2ML IJ SOLN: 4.0000 mg | Freq: Four times a day (QID) | INTRAMUSCULAR | Status: DC | PRN

## 2023-12-30 MED ORDER — FLUOXETINE HCL 20 MG PO CAPS
20.0000 mg | ORAL_CAPSULE | Freq: Every day | ORAL | Status: DC
  Administered 2023-12-30 – 2023-12-31 (×2): 20 mg via ORAL
  Filled 2023-12-30 (×2): qty 1

## 2023-12-30 MED ORDER — ACETAMINOPHEN 650 MG RE SUPP: 650.0000 mg | Freq: Four times a day (QID) | RECTAL | Status: DC | PRN

## 2023-12-30 NOTE — Progress Notes (Signed)
 Same day note  Roy Chavez is a 68 y.o. male with medical history significant of hypertension, anxiety, CKD who presented to the hospital with syncope.  He did have some diaphoresis and lightheadedness prior to the syncopal episode and had some abdominal and chest discomfort as well.  In the ED patient was hemodynamically stable.  Creatinine of 1.2 from baseline around 1.0.  Troponins were negative.  Lipase within normal limits.  Urinalysis was negative for infection.  UDS was positive for THC and cocaine.  Chest x-ray without any infiltrate.  CT head scan including C-spine did not show any fracture but some scalp contusion.  CT of the chest abdomen and pelvis without any acute findings.  Patient was then admitted to the hospital for further evaluation and treatment.   Patient seen and examined at bedside.  Patient was admitted to the hospital for syncopal episode  At the time of my evaluation, patient complains of feeling okay.  Denies headache nausea vomiting shortness of breath or chest pain  Physical examination reveals average built male not in obvious distress.  Clear breath sounds.  Moves all extremities  Laboratory data and imaging was reviewed  Assessment and Plan.  Syncope likely vasovagal.  Patient had prodromal state.  No chest pain.  Troponins negative.  EKG without arrhythmia..  Check 2D echocardiogram, telemetry monitoring.  Received IV hydration.  Encourage oral hydration.  Was able to ambulate without any dizziness lightheadedness today.   Essential hypertension continue amlodipine , lisinopril .  Blood pressure seems to be stable   Hyperlipidemia continue atorvastatin    Depression continue Prozac   History of CKD stage III. Creatinine on presentation at 1.2.  Likely at baseline.  Polysubstance abuse-urine drug screen positive for cocaine and THC.  Counseling done.  Encouraged quitting  No Charge  Signed,  Vernal Anselm Alstrom, MD [facility]

## 2023-12-30 NOTE — Hospital Course (Addendum)
 Roy Chavez is a 68 y.o. male with PMH of hypertension, anxiety, CKD who presented to the hospital with syncope.  He did have some diaphoresis and lightheadedness prior to the syncopal episode and had some abdominal and chest discomfort as well. In the ED : patient was hemodynamically stable.  Creatinine of 1.2 from baseline around 1.0.  Troponins were negative.  Lipase within normal limits.  Urinalysis was negative for infection.  UDS was positive for THC and cocaine.  Chest x-ray without any infiltrate. CT head scan including C-spine did not show any fracture but some scalp contusion. CT of the chest abdomen and pelvis without any acute findings.  Patient was then admitted to the hospital for further evaluation and treatment.  On further workup with echocardiogram unremarkable, likely vasovagal episode in the setting of polysubstance abuse. THC consulted substance counseling resources provided.  Patient has been encouraged to quit.  Orthostatic vitals negative, ambulatory and at this time okay for discharge  Subjective: Seen and examined today Overnight on room air, blood pressure 120s-140s, afebrile, VSS, Labs troponin negative x 2 on admission slightly increased creatinine otherwise stable UDS positive for cocaine and THC C/O being weak.  Discharge Diagnoses:   Syncope likely vasovagal: W/ prodromal state but no chest pain.  Troponins negative.  EKG without arrhythmia.  UDS positive for cocaine and THC question if substance abuse contributed to his syncope/vasovagal episode. TTE-EF 55 to 60% no RWMA mild asymmetric left ventricular hypertrophy of the basal septal segment.  RV SF normal mitral valve normal aortic valve tricuspid. Telemetry no acute finding.  Received IV hydration suspect likely vasovagal episode.  Check orthostatic vitals ambulate prior to discharge  He says he dnied any chest pain os shortness of breath before passign out had dizziness and felt he was going to pass out.   OV was normal this am. He says his bp dropped He says he was weak and had to lay down again this am.  Informed nursing staff to ambulate him in the hallway, again orthostatic vitals negative.  Essential hypertension: Stable, continue amlodipine .holding lisinopril    Hyperlipidemia continue atorvastatin    Depression continue Prozac   History of CKD stage III. Creatinine on presentation at 1.2. Likely at baseline.  Polysubstance abuse: urine drug screen positive for cocaine and THC.Counseling done.  Encouraged quitting. He said he did that a year ago   DVT prophylaxis: enoxaparin  (LOVENOX ) injection 40 mg Start: 12/30/23 1000 SCDs Start: 12/30/23 0026 Code Status:   Code Status: Full Code Family Communication: plan of care discussed with patient at bedside. Patient status is: Remains hospitalized because of severity of illness Level of care: Telemetry Medical   Dispo: The patient is from: home            Anticipated disposition: Home  Objective: Vitals last 24 hrs: Vitals:   12/31/23 0929 12/31/23 0934 12/31/23 0936 12/31/23 1206  BP: (!) 127/90 (!) 136/98 (!) 123/93 (!) 128/93  Pulse: (!) 55 (!) 51 66 77  Resp:    18  Temp:    98.7 F (37.1 C)  TempSrc:    Oral  SpO2: 99%   95%    Physical Examination: General exam: alert awake, oriented, older than stated age HEENT:Oral mucosa moist, Ear/Nose WNL grossly Respiratory system: Bilaterally clear BS,no use of accessory muscle Cardiovascular system: S1 & S2 +, No JVD. Gastrointestinal system: Abdomen soft,NT,ND, BS+ Nervous System: Alert, awake, moving all extremities,and following commands. Extremities: extremities warm, leg edema neg Skin: Warm, no rashes MSK: Normal  muscle bulk,tone, power

## 2023-12-30 NOTE — H&P (Signed)
 History and Physical    Roy Chavez FMW:[id number] DOB: 12-Sep-1955 DOA: 12/29/2023  PCP: Freddrick, No   Chief Complaint:  syncope  HPI: Roy Chavez is a 68 y.o. male with medical history significant of hypertension, CKD who presents emergency department due to syncopal episode.  Patient was shopping at the grocery store and had a syncopal episode.  He had a prodromal state where he started to feel diaphoretic and lightheaded and then fell down.  He woke up without knowing where he was.  He endorsed some abdominal pain and chest pain.  He denies hitting his head.  He presented to the ER where he was found to be afebrile and hemodynamically stable.  Labs were obtained on presentation which showed creatinine 1.2 baseline around 1, lipase within normal limits, troponin 8, 9, urinalysis negative for infection.  UDS is positive for THC and cocaine.  Patient had chest x-ray which showed no acute findings.  CT head showed no acute findings with scalp contusion.  CT spine showed no evidence of fracture.  CT chest abdomen pelvis demonstrated no acute findings.  Patient was admitted for further workup of syncope.  In discussion with patient he denies prior syncopal episodes.  No vertigo.  Endorses prodromal state.   Review of Systems: Review of Systems  Constitutional: Negative.  Negative for chills and fever.  HENT: Negative.    Eyes: Negative.   Respiratory: Negative.    Cardiovascular: Negative.   Gastrointestinal: Negative.   Genitourinary: Negative.   Musculoskeletal: Negative.   Skin: Negative.   Neurological: Negative.   Endo/Heme/Allergies: Negative.   Psychiatric/Behavioral: Negative.       As per HPI otherwise 10 point review of systems negative.   No Known Allergies  Past Medical History:  Diagnosis Date   Anxiety    CKD (chronic kidney disease), stage III (HCC)    Hypertension     No past surgical history on file.   reports that he has been smoking cigarettes. He has a  20 pack-year smoking history. He has never used smokeless tobacco. He reports current alcohol use of about 40.0 standard drinks of alcohol per week. He reports current drug use. Drug: Marijuana.  No family history on file.  Prior to Admission medications   Medication Sig Start Date End Date Taking? Authorizing Provider  amLODipine  (NORVASC ) 5 MG tablet TAKE 1 TABLET(5 MG) BY MOUTH DAILY 08/04/21   Brien Belvie BRAVO, MD  atorvastatin  (LIPITOR) 20 MG tablet Take 1 tablet (20 mg total) by mouth daily. 06/02/21   Brien Belvie BRAVO, MD  doxycycline  (VIBRAMYCIN ) 100 MG capsule Take 1 capsule (100 mg total) by mouth 2 (two) times daily. 05/12/21   Randol Simmonds, MD  FLUoxetine  (PROZAC ) 20 MG capsule Take 1 capsule (20 mg total) by mouth daily. 06/02/21   Brien Belvie BRAVO, MD  lisinopril  (ZESTRIL ) 20 MG tablet Take 1 tablet (20 mg total) by mouth daily. 06/02/21   Brien Belvie BRAVO, MD  Multiple Vitamin (MULTIVITAMIN WITH MINERALS) TABS tablet Take 1 tablet by mouth daily. One A Day    [provider]    Physical Exam: Vitals:   12/29/23 2315 12/29/23 2345 12/30/23 0000 12/30/23 0015  BP: 117/86 121/68 136/70 (!) 127/91  Pulse: 77 63 64 73  Resp: 16 17 14 10   Temp:      TempSrc:      SpO2: 100% 100% 100% 100%   Physical Exam Constitutional:      Appearance: He is normal weight.  HENT:  Head: Normocephalic.  Eyes:     Extraocular Movements: Extraocular movements intact.  Cardiovascular:     Rate and Rhythm: Normal rate and regular rhythm.  Pulmonary:     Effort: Pulmonary effort is normal.     Breath sounds: Normal breath sounds.  Abdominal:     General: Abdomen is flat.     Palpations: Abdomen is soft.  Skin:    General: Skin is warm.     Capillary Refill: Capillary refill takes less than 2 seconds.  Neurological:     General: No focal deficit present.     Mental Status: He is alert and oriented to person, place, and time.  Psychiatric:        Mood and Affect: Mood normal.         Labs on Admission: I have personally reviewed the patients's labs and imaging studies.  Assessment/Plan Principal Problem:   Syncope   # Vasovagal syncope - Patient endorsing prodromal state - Denies chest pain - Troponins negative - Normal sinus rhythm EKG  Plan: obtain echocardiogram Monitor on telemetry  # Hypertension-continue amlodipine , lisinopril   # Hyperlipidemia-continue atorvastatin   # Depression-continue Prozac   # Polysubstance abuse-encourage cessation   Admission status: Observation Telemetry Medical  Certification: The appropriate patient status for this patient is OBSERVATION. Observation status is judged to be reasonable and necessary in order to provide the required intensity of service to ensure the patient's safety. The patient's presenting symptoms, physical exam findings, and initial radiographic and laboratory data in the context of their medical condition is felt to place them at decreased risk for further clinical deterioration. Furthermore, it is anticipated that the patient will be medically stable for discharge from the hospital within 2 midnights of admission.     Lamar Dess MD [facility] If 7PM-7AM, please contact night-coverage [email or web address]  12/30/2023, 12:33 AM

## 2023-12-31 ENCOUNTER — Encounter (HOSPITAL_COMMUNITY): Payer: Self-pay | Admitting: Internal Medicine

## 2023-12-31 DIAGNOSIS — R55 Syncope and collapse: Secondary | ICD-10-CM | POA: Diagnosis not present

## 2023-12-31 LAB — HIV ANTIBODY (ROUTINE TESTING W REFLEX): HIV Screen 4th Generation wRfx: NONREACTIVE

## 2023-12-31 NOTE — Progress Notes (Signed)
 Transition of Care [facility]) - Inpatient Brief Assessment   Patient Details  Name: Roy Chavez MRN: [id number] Date of Birth: 1955-10-08  [facility] of Care [facility]) CM/SW Contact:    Rosaline JONELLE Joe, RN Phone Number: 12/31/2023, 3:09 PM   Clinical Narrative: CM met with the patient at the bedside to discuss IP Care management needs.  The patient lives alone and plans to return home when stable.   The patient inquired about needing a home health aide.  Patient is independent and will not qualify/need aide services since he does not need skilled home health services at the home for PT, RN.  I explained this to the patient.  Patient was positive for cocaine upon admission to the  hospital.  Patient was offered resources for substance abuse counseling and placed in the AVS.  Patient plans to return home today - and has transportation through friend/family to go home.  PCP follow up with [facility].      Transition of Care Asessment: Insurance and Status: (P) Insurance coverage has been reviewed Patient has primary care physician: (P) No Home environment has been reviewed: (P) from home alone Prior level of function:: (P) self Prior/Current Home Services: (P) No current home services Social Drivers of Health Review: (P) SDOH reviewed interventions complete Readmission risk has been reviewed: (P) Yes Transition of care needs: (P) transition of care needs identified, TOC will continue to follow

## 2023-12-31 NOTE — Discharge Summary (Signed)
 Physician Discharge Summary  Roy Chavez FMW:[id number] DOB: 04/02/1955 DOA: 12/29/2023  PCP: Pcp, No  Admit date: 12/29/2023 Discharge date: 12/31/2023 Recommendations for Outpatient Follow-up:  Follow up with PCP in 1 weeks-call for appointment Please obtain BMP/CBC in one week  Discharge Dispo: home Discharge Condition: Stable Code Status:   Code Status: Full Code Diet recommendation:  Diet Order             Diet - low sodium heart healthy           Diet regular Room service appropriate? Yes; Fluid consistency: Thin  Diet effective now                    Brief/Interim Summary:  Roy Chavez is a 68 y.o. male with PMH of hypertension, anxiety, CKD who presented to the hospital with syncope.  He did have some diaphoresis and lightheadedness prior to the syncopal episode and had some abdominal and chest discomfort as well. In the ED : patient was hemodynamically stable.  Creatinine of 1.2 from baseline around 1.0.  Troponins were negative.  Lipase within normal limits.  Urinalysis was negative for infection.  UDS was positive for THC and cocaine.  Chest x-ray without any infiltrate. CT head scan including C-spine did not show any fracture but some scalp contusion. CT of the chest abdomen and pelvis without any acute findings.  Patient was then admitted to the hospital for further evaluation and treatment.  On further workup with echocardiogram unremarkable, likely vasovagal episode in the setting of polysubstance abuse. THC consulted substance counseling resources provided.  Patient has been encouraged to quit.  Orthostatic vitals negative, ambulatory and at this time okay for discharge  Subjective: Seen and examined today Overnight on room air, blood pressure 120s-140s, afebrile, VSS, Labs troponin negative x 2 on admission slightly increased creatinine otherwise stable UDS positive for cocaine and THC C/O being weak.  Discharge Diagnoses:   Syncope likely  vasovagal: W/ prodromal state but no chest pain.  Troponins negative.  EKG without arrhythmia.  UDS positive for cocaine and THC question if substance abuse contributed to his syncope/vasovagal episode. TTE-EF 55 to 60% no RWMA mild asymmetric left ventricular hypertrophy of the basal septal segment.  RV SF normal mitral valve normal aortic valve tricuspid. Telemetry no acute finding.  Received IV hydration suspect likely vasovagal episode.  Check orthostatic vitals ambulate prior to discharge  He says he dnied any chest pain os shortness of breath before passign out had dizziness and felt he was going to pass out.  OV was normal this am. He says his bp dropped He says he was weak and had to lay down again this am.  Informed nursing staff to ambulate him in the hallway, again orthostatic vitals negative.  Essential hypertension: Stable, continue amlodipine .holding lisinopril    Hyperlipidemia continue atorvastatin    Depression continue Prozac   History of CKD stage III. Creatinine on presentation at 1.2. Likely at baseline.  Polysubstance abuse: urine drug screen positive for cocaine and THC.Counseling done.  Encouraged quitting. He said he did that a year ago   DVT prophylaxis: enoxaparin  (LOVENOX ) injection 40 mg Start: 12/30/23 1000 SCDs Start: 12/30/23 0026 Code Status:   Code Status: Full Code Family Communication: plan of care discussed with patient at bedside. Patient status is: Remains hospitalized because of severity of illness Level of care: Telemetry Medical   Dispo: The patient is from: home            Anticipated disposition: Home  Objective: Vitals last 24 hrs: Vitals:   12/31/23 0929 12/31/23 0934 12/31/23 0936 12/31/23 1206  BP: (!) 127/90 (!) 136/98 (!) 123/93 (!) 128/93  Pulse: (!) 55 (!) 51 66 77  Resp:    18  Temp:    98.7 F (37.1 C)  TempSrc:    Oral  SpO2: 99%   95%    Physical Examination: General exam: alert awake, oriented, older than stated  age HEENT:Oral mucosa moist, Ear/Nose WNL grossly Respiratory system: Bilaterally clear BS,no use of accessory muscle Cardiovascular system: S1 & S2 +, No JVD. Gastrointestinal system: Abdomen soft,NT,ND, BS+ Nervous System: Alert, awake, moving all extremities,and following commands. Extremities: extremities warm, leg edema neg Skin: Warm, no rashes MSK: Normal muscle bulk,tone, power        Consultation: None  Discharge Instructions  Discharge Instructions     Ambulatory Referral for Lung Cancer Scre   Complete by: As directed    Diet - low sodium heart healthy   Complete by: As directed    Discharge instructions   Complete by: As directed    Please call call MD or return to ER for similar or worsening recurring problem that brought you to hospital or if any fever,nausea/vomiting,abdominal pain, uncontrolled pain, chest pain,  shortness of breath or any other alarming symptoms.  Please follow-up your doctor as instructed in a week time and call the office for appointment.  Please avoid alcohol, smoking, or any other illicit substance and maintain healthy habits including taking your regular medications as prescribed.  You were cared for by a hospitalist during your hospital stay. If you have any questions about your discharge medications or the care you received while you were in the hospital after you are discharged, you can call the unit and ask to speak with the hospitalist on call if the hospitalist that took care of you is not available.  Once you are discharged, your primary care physician will handle any further medical issues. Please note that NO REFILLS for any discharge medications will be authorized once you are discharged, as it is imperative that you return to your primary care physician (or establish a relationship with a primary care physician if you do not have one) for your aftercare needs so that they can reassess your need for medications and monitor your lab  values   Discharge instructions   Complete by: As directed    Please call call MD or return to ER for similar or worsening recurring problem that brought you to hospital or if any fever,nausea/vomiting,abdominal pain, uncontrolled pain, chest pain,  shortness of breath or any other alarming symptoms.  Please follow-up your doctor as instructed in a week time and call the office for appointment.  Please avoid alcohol, smoking, or any other illicit substance and maintain healthy habits including taking your regular medications as prescribed.  You were cared for by a hospitalist during your hospital stay. If you have any questions about your discharge medications or the care you received while you were in the hospital after you are discharged, you can call the unit and ask to speak with the hospitalist on call if the hospitalist that took care of you is not available.  Once you are discharged, your primary care physician will handle any further medical issues. Please note that NO REFILLS for any discharge medications will be authorized once you are discharged, as it is imperative that you return to your primary care physician (or establish a relationship with a primary  care physician if you do not have one) for your aftercare needs so that they can reassess your need for medications and monitor your lab values   Increase activity slowly   Complete by: As directed    Increase activity slowly   Complete by: As directed       Allergies as of 12/31/2023   No Known Allergies      Medication List     STOP taking these medications    lisinopril  20 MG tablet Commonly known as: ZESTRIL        TAKE these medications    amLODipine  5 MG tablet Commonly known as: NORVASC  TAKE 1 TABLET(5 MG) BY MOUTH DAILY   atorvastatin  20 MG tablet Commonly known as: LIPITOR Take 1 tablet (20 mg total) by mouth daily.   FLUoxetine  20 MG capsule Commonly known as: PROzac  Take 1 capsule (20 mg total) by  mouth daily.   multivitamin with minerals Tabs tablet Take 1 tablet by mouth daily. One A Day        Follow-up Information     [facility] Follow up in 1 week(s).   Contact information: [address] [place]  [address] [phone]               No Known Allergies  The results of significant diagnostics from this hospitalization (including imaging, microbiology, ancillary and laboratory) are listed below for reference.    Microbiology: No results found for this or any previous visit (from the past 240 hours).  Procedures/Studies: ECHOCARDIOGRAM COMPLETE Result Date: 12/30/2023    ECHOCARDIOGRAM REPORT   Patient Name:   Roy Chavez Date of Exam: 12/30/2023 Medical Rec #:  [id number]        Height:       68.0 in Accession #:    [id number]       Weight:       165.0 lb Date of Birth:  Nov 20, 1955        BSA:          1.883 m Patient Age:    68 years         BP:           131/79 mmHg Patient Gender: M                HR:           53 bpm. Exam Location:  Inpatient Procedure: 2D Echo, Cardiac Doppler and Color Doppler (Both Spectral and Color            Flow Doppler were utilized during procedure). Indications:    R55 Syncope  History:        Patient has no prior history of Echocardiogram examinations.                 Risk Factors:Active Polysubstance Abuse and Hypertension.  Sonographer:    Damien Senior RDCS Referring Phys: [id number] ROBERT DORRELL IMPRESSIONS  1. Left ventricular ejection fraction, by estimation, is 55 to 60%. The left ventricle has normal function. The left ventricle has no regional wall motion abnormalities. There is mild asymmetric left ventricular hypertrophy of the basal-septal segment. Left ventricular diastolic parameters were normal.  2. Right ventricular systolic function is normal. The right ventricular size is normal. Tricuspid regurgitation signal is inadequate for assessing PA pressure.  3. The  mitral valve is grossly normal. Trivial mitral valve regurgitation. No evidence of mitral stenosis.  4. The aortic valve is tricuspid. Aortic  valve regurgitation is not visualized. No aortic stenosis is present.  5. The inferior vena cava is normal in size with greater than 50% respiratory variability, suggesting right atrial pressure of 3 mmHg. FINDINGS  Left Ventricle: Left ventricular ejection fraction, by estimation, is 55 to 60%. The left ventricle has normal function. The left ventricle has no regional wall motion abnormalities. The left ventricular internal cavity size was normal in size. There is  mild asymmetric left ventricular hypertrophy of the basal-septal segment. Left ventricular diastolic parameters were normal. Right Ventricle: The right ventricular size is normal. No increase in right ventricular wall thickness. Right ventricular systolic function is normal. Tricuspid regurgitation signal is inadequate for assessing PA pressure. Left Atrium: Left atrial size was normal in size. Right Atrium: Right atrial size was normal in size. Pericardium: There is no evidence of pericardial effusion. Mitral Valve: The mitral valve is grossly normal. Trivial mitral valve regurgitation. No evidence of mitral valve stenosis. Tricuspid Valve: The tricuspid valve is grossly normal. Tricuspid valve regurgitation is mild . No evidence of tricuspid stenosis. Aortic Valve: The aortic valve is tricuspid. Aortic valve regurgitation is not visualized. No aortic stenosis is present. Pulmonic Valve: The pulmonic valve was grossly normal. Pulmonic valve regurgitation is trivial. No evidence of pulmonic stenosis. Aorta: The aortic root is normal in size and structure. Venous: The inferior vena cava is normal in size with greater than 50% respiratory variability, suggesting right atrial pressure of 3 mmHg. IAS/Shunts: The atrial septum is grossly normal.  LEFT VENTRICLE PLAX 2D LVIDd:         4.30 cm   Diastology LVIDs:          2.90 cm   LV e' medial:    9.90 cm/s LV PW:         1.00 cm   LV E/e' medial:  7.6 LV IVS:        1.20 cm   LV e' lateral:   11.50 cm/s LVOT diam:     1.95 cm   LV E/e' lateral: 6.5 LV SV:         82 LV SV Index:   43 LVOT Area:     2.99 cm  RIGHT VENTRICLE RV S prime:     13.40 cm/s TAPSE (M-mode): 2.2 cm LEFT ATRIUM           Index        RIGHT ATRIUM           Index LA diam:      3.29 cm 1.75 cm/m   RA Area:     17.60 cm LA Vol (A2C): 45.4 ml 24.11 ml/m  RA Volume:   50.90 ml  27.03 ml/m LA Vol (A4C): 47.6 ml 25.27 ml/m  AORTIC VALVE LVOT Vmax:   125.00 cm/s LVOT Vmean:  89.700 cm/s LVOT VTI:    0.274 m  AORTA Ao Root diam: 3.47 cm MITRAL VALVE MV Area (PHT): 3.93 cm    SHUNTS MV Decel Time: 193 msec    Systemic VTI:  0.27 m MV E velocity: 74.90 cm/s  Systemic Diam: 1.95 cm MV A velocity: 55.00 cm/s MV E/A ratio:  1.36 Darryle Decent MD Electronically signed by Darryle Decent MD Signature Date/Time: 12/30/2023/4:08:29 PM    Final    CT Head Wo Contrast Result Date: 12/29/2023 EXAM: CT HEAD AND CERVICAL SPINE 12/29/2023 09:03:05 PM TECHNIQUE: CT of the head and cervical spine was performed without the administration of intravenous contrast. Multiplanar reformatted images are provided  for review. Automated exposure control, iterative reconstruction, and/or weight based adjustment of the mA/kV was utilized to reduce the radiation dose to as low as reasonably achievable. COMPARISON: None available. CLINICAL HISTORY: Polytrauma, blunt. Loss of Consciousness; Abdominal Pain; Hypotension FINDINGS: CT HEAD BRAIN AND VENTRICLES: No acute intracranial hemorrhage. No mass effect or midline shift. No abnormal extra-axial fluid collection. No evidence of acute infarct. No hydrocephalus. Patchy white matter changes, nonspecific but compatible with chronic microvascular ischemic change. ORBITS: No acute abnormality. SINUSES AND MASTOIDS: Tonsils. Left mastoid effusion and middle ear fluid. Remote nasal bone fracture.  SOFT TISSUES AND SKULL: No acute skull fracture. Vertical scalp contusion. CT CERVICAL SPINE BONES AND ALIGNMENT: No acute fracture or traumatic malalignment. 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 Multilevel facet and uncovertebral hypertrophy with varying degrees of neural foraminal stenosis. SOFT TISSUES: No prevertebral soft tissue swelling. IMPRESSION: 1. No acute intracranial abnormality. 2. No acute fracture or traumatic malalignment of the cervical spine. 3. Vertical scalp contusion. 4. Left mastoid effusion and middle ear fluid. Electronically signed by: Gilmore Molt MD 12/29/2023 09:23 PM EDT RP Workstation: [id number]   CT Cervical Spine Wo Contrast Result Date: 12/29/2023 EXAM: CT HEAD AND CERVICAL SPINE 12/29/2023 09:03:05 PM TECHNIQUE: CT of the head and cervical spine was performed without the administration of intravenous contrast. Multiplanar reformatted images are provided for review. Automated exposure control, iterative reconstruction, and/or weight based adjustment of the mA/kV was utilized to reduce the radiation dose to as low as reasonably achievable. COMPARISON: None available. CLINICAL HISTORY: Polytrauma, blunt. Loss of Consciousness; Abdominal Pain; Hypotension FINDINGS: CT HEAD BRAIN AND VENTRICLES: No acute intracranial hemorrhage. No mass effect or midline shift. No abnormal extra-axial fluid collection. No evidence of acute infarct. No hydrocephalus. Patchy white matter changes, nonspecific but compatible with chronic microvascular ischemic change. ORBITS: No acute abnormality. SINUSES AND MASTOIDS: Tonsils. Left mastoid effusion and middle ear fluid. Remote nasal bone fracture. SOFT TISSUES AND SKULL: No acute skull fracture. Vertical scalp contusion. CT CERVICAL SPINE BONES AND ALIGNMENT: No acute fracture or traumatic malalignment. DEGENERATIVE CHANGES: Moderate to  severe multilevel degenerative disc disease including disc height loss, endplate spurring, and endplate sclerosis. Multilevel facet and uncovertebral hypertrophy with varying degrees of neural foraminal stenosis. SOFT TISSUES: No prevertebral soft tissue swelling. IMPRESSION: 1. No acute intracranial abnormality. 2. No acute fracture or traumatic malalignment of the cervical spine. 3. Vertical scalp contusion. 4. Left mastoid effusion and middle ear fluid. Electronically signed by: Gilmore Molt MD 12/29/2023 09:23 PM EDT RP Workstation: [id number]   CT CHEST ABDOMEN PELVIS W CONTRAST Result Date: 12/29/2023 EXAM: CT CHEST, ABDOMEN AND PELVIS WITH CONTRAST 12/29/2023 09:03:05 PM TECHNIQUE: CT of the chest, abdomen and pelvis was performed with the administration of intravenous contrast. Multiplanar reformatted images are provided for review. Automated exposure control, iterative reconstruction, and/or weight based adjustment of the mA/kV was utilized to reduce the radiation dose to as low as reasonably achievable. COMPARISON: CT chest dated 07/19/2023. CLINICAL HISTORY: Polytrauma, blunt. Loss of Consciousness; Abdominal Pain; Hypotension ; 75ml Omni350. FINDINGS: CHEST: MEDIASTINUM AND LYMPH NODES: Heart and pericardium are unremarkable. The central airways are clear. No mediastinal, hilar or axillary lymphadenopathy. LUNGS AND PLEURA: No focal consolidation or pulmonary edema. No pleural effusion or pneumothorax. ABDOMEN AND PELVIS: LIVER: 2.0 cm posterior right hepatic hemangioma, benign. Additional probable hemangioma in the posterior right hepatic dome (image 56). GALLBLADDER AND BILE DUCTS: Gallbladder is unremarkable. No biliary ductal  dilatation. SPLEEN: No acute abnormality. PANCREAS: No acute abnormality. ADRENAL GLANDS: No acute abnormality. KIDNEYS, URETERS AND BLADDER: No stones in the kidneys or ureters. No hydronephrosis. No perinephric or periureteral stranding. Urinary bladder is unremarkable.  GI AND BOWEL: Stomach demonstrates no acute abnormality. There is no bowel obstruction. REPRODUCTIVE ORGANS: Prostate is unremarkable. PERITONEUM AND RETROPERITONEUM: No ascites. No free air. VASCULATURE: Aorta is normal in caliber. ABDOMINAL AND PELVIS LYMPH NODES: No lymphadenopathy. BONES AND SOFT TISSUES: Mild degenerative changes at L5-S1. No acute osseous abnormality. No focal soft tissue abnormality. IMPRESSION: 1. No acute abnormality of the chest, abdomen, or pelvis. Electronically signed by: Pinkie Pebbles MD 12/29/2023 09:23 PM EDT RP Workstation: [id number]   DG Chest Portable 1 View Result Date: 12/29/2023 CLINICAL DATA:  Syncope. EXAM: PORTABLE CHEST 1 VIEW COMPARISON:  01/21/2021 FINDINGS: The cardiomediastinal contours are stable. The lungs are clear. Pulmonary vasculature is normal. No consolidation, pleural effusion, or pneumothorax. No acute osseous abnormalities are seen. IMPRESSION: No active disease. Electronically Signed   By: Andrea Gasman M.D.   On: 12/29/2023 20:45    Labs: BNP (last 3 results) No results for input(s): BNP in the last 8760 hours. Basic Metabolic Panel: Recent Labs  Lab 12/29/23 1952  NA 140  K 4.1  CL 106  CO2 22  GLUCOSE 98  BUN 16  CREATININE 1.25*  CALCIUM  9.3   Liver Function Tests: Recent Labs  Lab 12/29/23 1952  AST 21  ALT 15  ALKPHOS 46  BILITOT 0.8  PROT 6.6  ALBUMIN 4.0   Recent Labs  Lab 12/29/23 1952  LIPASE 32   No results for input(s): AMMONIA in the last 168 hours. CBC: Recent Labs  Lab 12/29/23 2039  WBC 9.7  HGB 13.3  HCT 39.8  MCV 91.9  PLT 240   CBG: Recent Labs  Lab 12/29/23 1956  GLUCAP 97   Hgb A1c No results for input(s): HGBA1C in the last 72 hours. Anemia work up No results for input(s): VITAMINB12, FOLATE, FERRITIN, TIBC, IRON, RETICCTPCT in the last 72 hours.  Cardiac Enzymes: No results for input(s): CKTOTAL, CKMB, CKMBINDEX, TROPONINI in the last 168  hours. BNP: Invalid input(s): POCBNP D-Dimer No results for input(s): DDIMER in the last 72 hours. Lipid Profile No results for input(s): CHOL, HDL, LDLCALC, TRIG, CHOLHDL, LDLDIRECT in the last 72 hours. Thyroid  function studies No results for input(s): TSH, T4TOTAL, T3FREE, THYROIDAB in the last 72 hours.  Invalid input(s): FREET3 Urinalysis    Component Value Date/Time   COLORURINE YELLOW 12/29/2023 2209   APPEARANCEUR CLEAR 12/29/2023 2209   LABSPEC >1.046 (H) 12/29/2023 2209   PHURINE 6.0 12/29/2023 2209   GLUCOSEU NEGATIVE 12/29/2023 2209   HGBUR NEGATIVE 12/29/2023 2209   BILIRUBINUR NEGATIVE 12/29/2023 2209   KETONESUR NEGATIVE 12/29/2023 2209   PROTEINUR NEGATIVE 12/29/2023 2209   UROBILINOGEN 1.0 09/15/2009 1126   NITRITE NEGATIVE 12/29/2023 2209   LEUKOCYTESUR NEGATIVE 12/29/2023 2209   Sepsis Labs Recent Labs  Lab 12/29/23 2039  WBC 9.7   Microbiology No results found for this or any previous visit (from the past 240 hours).   Time coordinating discharge: 25 minutes  SIGNED: Mennie LAMY, MD  [facility] 12/31/2023, 3:23 PM  If 7PM-7AM, please contact night-coverage [email or web address]

## 2023-12-31 NOTE — Care Management Obs Status (Signed)
 MEDICARE OBSERVATION STATUS NOTIFICATION   Patient Details  Name: Roy Chavez MRN: [id number] Date of Birth: Jun 30, 1955   Medicare Observation Status Notification Given:  Yes Verbally reviewed observation notice with Marybelle Marina telephonically a copy will be mailed to the patient home    Karrissa Parchment 12/31/2023, 9:00 AM

## 2023-12-31 NOTE — Progress Notes (Signed)
 OT Cancellation Note  Patient Details Name: Mckyle Solanki MRN: [id number] DOB: 05/31/1955   Cancelled Treatment:    Reason Eval/Treat Not Completed: OT screened, no needs identified, will sign off (pt mobilizing independently has no concerns regarding self care or ADL/IADLs at home. Will screen. Please reconsult if there is a change in pt status)  Jahlon Baines K, OTD, OTR/L [facility] [phone]   Laneta POUR Koonce 12/31/2023, 3:20 PM

## 2024-02-23 ENCOUNTER — Other Ambulatory Visit (HOSPITAL_COMMUNITY): Payer: Self-pay

## 2024-03-17 ENCOUNTER — Emergency Department (HOSPITAL_COMMUNITY)

## 2024-03-17 ENCOUNTER — Emergency Department (HOSPITAL_COMMUNITY)
Admission: EM | Admit: 2024-03-17 | Discharge: 2024-03-17 | Disposition: A | Discharge status: Home / Self Care | Attending: Emergency Medicine | Admitting: Emergency Medicine

## 2024-03-17 ENCOUNTER — Other Ambulatory Visit: Payer: Self-pay

## 2024-03-17 ENCOUNTER — Encounter (HOSPITAL_COMMUNITY): Payer: Self-pay | Admitting: Emergency Medicine

## 2024-03-17 DIAGNOSIS — R1032 Left lower quadrant pain: Secondary | ICD-10-CM | POA: Insufficient documentation

## 2024-03-17 DIAGNOSIS — N189 Chronic kidney disease, unspecified: Secondary | ICD-10-CM | POA: Insufficient documentation

## 2024-03-17 DIAGNOSIS — R4182 Altered mental status, unspecified: Secondary | ICD-10-CM | POA: Diagnosis not present

## 2024-03-17 DIAGNOSIS — Z79899 Other long term (current) drug therapy: Secondary | ICD-10-CM | POA: Diagnosis not present

## 2024-03-17 DIAGNOSIS — I129 Hypertensive chronic kidney disease with stage 1 through stage 4 chronic kidney disease, or unspecified chronic kidney disease: Secondary | ICD-10-CM | POA: Insufficient documentation

## 2024-03-17 LAB — URINALYSIS, ROUTINE W REFLEX MICROSCOPIC
Bilirubin Urine: NEGATIVE
Glucose, UA: NEGATIVE mg/dL
Hgb urine dipstick: NEGATIVE
Ketones, ur: NEGATIVE mg/dL
Leukocytes,Ua: NEGATIVE
Nitrite: NEGATIVE
Protein, ur: NEGATIVE mg/dL
Specific Gravity, Urine: 1.057 — ABNORMAL HIGH (ref 1.005–1.030)
pH: 5 (ref 5.0–8.0)

## 2024-03-17 LAB — URINE DRUG SCREEN
Amphetamines: NEGATIVE
Barbiturates: NEGATIVE
Benzodiazepines: NEGATIVE
Cocaine: NEGATIVE
Fentanyl: NEGATIVE
Methadone Scn, Ur: NEGATIVE
Opiates: NEGATIVE
Tetrahydrocannabinol: POSITIVE — AB

## 2024-03-17 LAB — CBC
HCT: 46.4 % (ref 39.0–52.0)
Hemoglobin: 15.2 g/dL (ref 13.0–17.0)
MCH: 30 pg (ref 26.0–34.0)
MCHC: 32.8 g/dL (ref 30.0–36.0)
MCV: 91.5 fL (ref 80.0–100.0)
Platelets: 284 K/uL (ref 150–400)
RBC: 5.07 MIL/uL (ref 4.22–5.81)
RDW: 15.3 % (ref 11.5–15.5)
WBC: 7.1 K/uL (ref 4.0–10.5)
nRBC: 0 % (ref 0.0–0.2)

## 2024-03-17 LAB — COMPREHENSIVE METABOLIC PANEL WITH GFR
ALT: 15 U/L (ref 0–44)
AST: 26 U/L (ref 15–41)
Albumin: 4.4 g/dL (ref 3.5–5.0)
Alkaline Phosphatase: 56 U/L (ref 38–126)
Anion gap: 10 (ref 5–15)
BUN: 16 mg/dL (ref 8–23)
CO2: 20 mmol/L — ABNORMAL LOW (ref 22–32)
Calcium: 9.3 mg/dL (ref 8.9–10.3)
Chloride: 108 mmol/L (ref 98–111)
Creatinine, Ser: 0.88 mg/dL (ref 0.61–1.24)
GFR, Estimated: >60 mL/min
Glucose, Bld: 111 mg/dL — ABNORMAL HIGH (ref 70–99)
Potassium: 4.9 mmol/L (ref 3.5–5.1)
Sodium: 139 mmol/L (ref 135–145)
Total Bilirubin: 0.8 mg/dL (ref 0.0–1.2)
Total Protein: 7.4 g/dL (ref 6.5–8.1)

## 2024-03-17 LAB — MAGNESIUM: Magnesium: 2.1 mg/dL (ref 1.7–2.4)

## 2024-03-17 LAB — LIPASE, BLOOD: Lipase: 27 U/L (ref 11–51)

## 2024-03-17 LAB — CBG MONITORING, ED: Glucose-Capillary: 124 mg/dL — ABNORMAL HIGH (ref 70–99)

## 2024-03-17 LAB — TROPONIN T, HIGH SENSITIVITY
Troponin T High Sensitivity: <15 ng/L (ref 0–19)
Troponin T High Sensitivity: <15 ng/L (ref 0–19)

## 2024-03-17 MED ORDER — OXYCODONE-ACETAMINOPHEN 5-325 MG PO TABS
1.0000 | ORAL_TABLET | Freq: Once | ORAL | Status: AC
  Administered 2024-03-17: 1 via ORAL
  Filled 2024-03-17: qty 1

## 2024-03-17 MED ORDER — METHOCARBAMOL 500 MG PO TABS
1000.0000 mg | ORAL_TABLET | Freq: Once | ORAL | Status: AC
  Administered 2024-03-17: 1000 mg via ORAL
  Filled 2024-03-17: qty 2

## 2024-03-17 MED ORDER — IOHEXOL 350 MG/ML SOLN
75.0000 mL | Freq: Once | INTRAVENOUS | Status: AC | PRN
  Administered 2024-03-17: 75 mL via INTRAVENOUS

## 2024-03-17 MED ORDER — LACTATED RINGERS IV BOLUS
1000.0000 mL | Freq: Once | INTRAVENOUS | Status: AC
  Administered 2024-03-17: 1000 mL via INTRAVENOUS

## 2024-03-17 NOTE — ED Notes (Signed)
 Pt

## 2024-03-17 NOTE — ED Notes (Signed)
 Assumed care of pt found him alert and oriented in bed.  Pt informed that he needed a urine sample.  Pt's IV was not functioning however RN corrected and pt is now getting fluids.  No complaints at this time.

## 2024-03-17 NOTE — ED Notes (Incomplete)
 Assumed care of pt, found him alert and oriented in bed,, no signs of distress noted.  Pt's IV had not been running, RN corrected.  Pt educated on the need for urine.  Urinal bedside.  No complaints at this

## 2024-03-17 NOTE — ED Notes (Signed)
 Pt noted to be slumped over in his chair in the waiting room, sweating profusely. Vitals reassesed. Dinamap BP showed 54/33. Pt immediately moved to room

## 2024-03-17 NOTE — ED Triage Notes (Signed)
 EMS reports left flank, upper and lower abdominal pain. Tender to palpation. Symptoms started this am with no other symptoms. BP 150/100, HR 76, r 16. 95% RA. 20 g IV left forearm

## 2024-03-17 NOTE — ED Provider Triage Note (Signed)
 Emergency Medicine Provider Triage Evaluation Note  Endy Easterly , a 69 y.o. male  was evaluated in triage.  Pt complains of left lower rib pain.  Review of Systems  Positive: Chest pain, left lower rib pain Negative: Headache, nausea, vomiting  Physical Exam  There were no vitals taken for this visit. Gen:   Awake, no distress   Resp:  Normal effort  MSK:   Moves extremities without difficulty, tenderness present in left lower ribs  Medical Decision Making  Medically screening exam initiated at 11:39 AM.  Appropriate orders placed.  Yamin Swingler was informed that the remainder of the evaluation will be completed by another provider, this initial triage assessment does not replace that evaluation, and the importance of remaining in the ED until their evaluation is complete.  Patient presents for atraumatic left lower rib pain.  He states that he woke up with chest pain which then migrated to the left lower anterolateral ribs.  He has ongoing pain in this time since then.  Tenderness is present on exam.   Melvenia Motto, MD 03/17/24 [phone]

## 2024-03-17 NOTE — ED Notes (Signed)
 CT brought pt. Back and took him to the bathroom without notifying RN, was not able to get a urine sample

## 2024-03-17 NOTE — ED Notes (Signed)
 Got patient on the monitor did EKG shown to Dr Ellouise patient is resting with call bell in reach

## 2024-03-17 NOTE — Discharge Instructions (Signed)
 Thank you for letting us  take care of you today.  We did blood work that was reassuring.  We also did CT scans did not show any acute abnormalities in your abdomen that may be contributing to this.  We recommend that you use Tylenol  and ibuprofen .  Please come back if your symptoms do not get better or continue to worsen.

## 2024-03-17 NOTE — ED Provider Notes (Signed)
 Patient is a 69 year old male who presented today for evaluation of altered mental status as well as flank pain as well as chest pain.  Patient remained clinically and hemodynamically stable during his time in the emergency department.  The previous team ordered laboratory evaluation did not show any acute abnormalities including troponin, lipase, CBC and metabolic panel.  We also obtain CT imaging of his abdomen as well as head did not show any acute process.  At the my reassessment patient had improvement of overall symptoms.  No gross discomfort.  Patient is currently mentating well, GCS 15 and responding to questions appropriately.  No evidence of clinical intoxication or withdrawals at this point in time.  Unclear etiology of patient's symptoms however felt stable for discharge.  Recommended conservative management at home.  Strict return precautions discussed. Physical Exam  BP (!) 146/97   Pulse 62   Temp 97.6 F (36.4 C) (Oral)   Resp 14   SpO2 97%   Physical Exam  Procedures  Procedures  ED Course / MDM   Clinical Course as of 03/17/24 2103  Mon Mar 17, 2024  1545 S - PSU, LLQ abdominal pain. AMS. Emergency contact over weekend. F/u imaging and reassess [DZ]    Clinical Course User Index [DZ] Laurita Sieving, MD   Medical Decision Making Amount and/or Complexity of Data Reviewed Labs: ordered. Radiology: ordered.  Risk Prescription drug management.         Laurita Sieving, MD 03/17/24 7867    Tonia Chew, MD 03/17/24 2256

## 2024-03-17 NOTE — ED Provider Notes (Signed)
 " [place] [facility] Provider Note   CSN: [id number] Arrival date & time: 03/17/24  1115     Patient presents with: No chief complaint on file.   Roy Chavez is a 69 y.o. male.  HPI Patient is a 69 year old male presenting ED today for concerns for acute onset left lower quadrant abdominal pain radiating to left flank.  Reports that this began suddenly.  Describes it as sharp.  Patient is reportedly altered reporting the year is 2006.  Spoke with emergency contact, who noted that he had been complaining of some left sided pain that was rating up to his chest.  Denies fever, headache, blurry vision, nausea, vomiting, diarrhea, melena, hematochezia, dysuria, hematuria, rashes, lower leg swelling.  Prior to Admission medications  Medication Sig Start Date End Date Taking? Authorizing Provider  amLODipine  (NORVASC ) 5 MG tablet TAKE 1 TABLET(5 MG) BY MOUTH DAILY 08/04/21   Brien Belvie BRAVO, MD  atorvastatin  (LIPITOR) 20 MG tablet Take 1 tablet (20 mg total) by mouth daily. 06/02/21   Brien Belvie BRAVO, MD  FLUoxetine  (PROZAC ) 20 MG capsule Take 1 capsule (20 mg total) by mouth daily. 06/02/21   Brien Belvie BRAVO, MD  Multiple Vitamin (MULTIVITAMIN WITH MINERALS) TABS tablet Take 1 tablet by mouth daily. One A Day Patient not taking: Reported on 12/30/2023    [provider]    Allergies: Patient has no known allergies.    Review of Systems  Gastrointestinal:  Positive for abdominal pain.  All other systems reviewed and are negative.   Updated Vital Signs BP (!) 140/93 (BP Location: Right Arm)   Pulse 71   Temp 97.8 F (36.6 C) (Oral)   Resp 15   SpO2 100%   Physical Exam Vitals and nursing note reviewed.  Constitutional:      General: He is not in acute distress.    Appearance: Normal appearance. He is not ill-appearing or diaphoretic.  HENT:     Head: Normocephalic and atraumatic.  Eyes:     General: No scleral icterus.        Right eye: No discharge.        Left eye: No discharge.     Extraocular Movements: Extraocular movements intact.     Conjunctiva/sclera: Conjunctivae normal.  Cardiovascular:     Rate and Rhythm: Normal rate and regular rhythm.     Pulses: Normal pulses.     Heart sounds: Normal heart sounds. No murmur heard.    No friction rub. No gallop.  Pulmonary:     Effort: Pulmonary effort is normal. No respiratory distress.     Breath sounds: No stridor. No wheezing, rhonchi or rales.  Chest:     Chest wall: No tenderness.  Abdominal:     General: Abdomen is flat. There is no distension.     Palpations: Abdomen is soft.     Tenderness: There is abdominal tenderness (Left lower quadrant abdominal tenderness to palpation.). There is guarding. There is no right CVA tenderness, left CVA tenderness or rebound.  Musculoskeletal:        General: No swelling, deformity or signs of injury.     Cervical back: Normal range of motion. No rigidity.     Right lower leg: No edema.     Left lower leg: No edema.  Skin:    General: Skin is warm and dry.     Findings: No bruising, erythema or lesion.  Neurological:     General: No focal deficit  present.     Mental Status: He is alert.     Sensory: No sensory deficit.     Comments: Alert noted x 3 to event, person, place but is unable provide correct time.  Able to move all extremities grossly, including pushing himself up in bed.  Psychiatric:        Mood and Affect: Mood normal.     (all labs ordered are listed, but only abnormal results are displayed) Labs Reviewed  COMPREHENSIVE METABOLIC PANEL WITH GFR - Abnormal; Notable for the following components:      Result Value   CO2 20 (*)    Glucose, Bld 111 (*)    All other components within normal limits  CBG MONITORING, ED - Abnormal; Notable for the following components:   Glucose-Capillary 124 (*)    All other components within normal limits  CBC  LIPASE, BLOOD  MAGNESIUM  URINE DRUG  SCREEN  URINALYSIS, ROUTINE W REFLEX MICROSCOPIC  TROPONIN T, HIGH SENSITIVITY  TROPONIN T, HIGH SENSITIVITY    EKG: EKG Interpretation Date/Time:  Monday March 17 2024 13:35:46 EST Ventricular Rate:  51 PR Interval:  181 QRS Duration:  88 QT Interval:  450 QTC Calculation: 415 R Axis:   58  Text Interpretation: Sinus rhythm Probable left atrial enlargement Probable anteroseptal infarct, old Nonspecific T abnormalities, lateral leads Confirmed by Ellouise Fine ([id number]) on 03/17/2024 2:04:39 PM  Radiology: DG Chest 1 View Result Date: 03/17/2024 CLINICAL DATA:  Left flank pain, chest pain EXAM: CHEST  1 VIEW COMPARISON:  12/29/2023 FINDINGS: Single frontal view of the chest demonstrates an unremarkable cardiac silhouette. No acute airspace disease, effusion, or pneumothorax. No acute bony abnormalities. IMPRESSION: 1. No acute intrathoracic process. Electronically Signed   By: Ozell Daring M.D.   On: 03/17/2024 15:36    Procedures   Medications Ordered in the ED  lactated ringers  bolus 1,000 mL (has no administration in time range)  oxyCODONE -acetaminophen  (PERCOCET/ROXICET) 5-325 MG per tablet 1 tablet (1 tablet Oral Given 03/17/24 1156)  methocarbamol  (ROBAXIN ) tablet 1,000 mg (1,000 mg Oral Given 03/17/24 1156)    Clinical Course as of 03/17/24 1551  Mon Mar 17, 2024  1545 S - PSU, LLQ abdominal pain. AMS. Emergency contact over weekend. F/u imaging and reassess [DZ]    Clinical Course User Index [DZ] Laurita Sieving, MD   Medical Decision Making Amount and/or Complexity of Data Reviewed Labs: ordered. Radiology: ordered.  This patient is a 69 year old male who presents to the ED for concern of left lower abdominal pain rating to left flank, reporting left lower rib pain to triage provider.  This happened acutely today.  Patient was alert and orient x 3 with previous history of polysubstance abuse.  Moving all extremities grossly but spoke with emergency contact who  noted that he had reported some bilateral chest pain recently to her.  No other associated symptoms at this time.  On physical exam, patient is in no acute distress, afebrile, alert and orient x 3, speaking in full sentences, nontachypneic, nontachycardic.  Though does have some left lower quadrant abdominal tenderness to palpation with some mild guarding.  No rebound tenderness.  No CVA tenderness bilaterally.  Current presentation, will obtain labs and imaging.  Suspicious possible drug intoxication present at this time with patient's current status.  However pending imaging and labs.  With current lab work unremarkable at this time.  Differential diagnoses prior to evaluation: The emergent differential diagnosis includes, but is not limited to, drug intoxication, metabolic  disturbance, nephrolithiasis, diverticulitis, pancreatitis, UTI, pyelonephritis, CVA. This is not an exhaustive differential.   Past Medical History / Co-morbidities / Social History: HTN, anxiety, CKD  Additional history: Chart reviewed. Pertinent results include:  Recent admitted on 12/29/2023 for syncope with collapse noted to have orthostatic vitals, suspected to be likely secondary to vasovagal and dehydration. Lab Tests/Imaging studies: I personally interpreted labs/imaging and the pertinent results include: CBC unremarkable CMP unremarkable .Magnesium and lipase unremarkable Troponin undetectable UA pending UDS pending Chest x-ray shows no acute process  I agree with the radiologist interpretation.  Cardiac monitoring: EKG obtained and interpreted by myself and attending physician which shows: Sinus rhythm  EKG Interpretation Date/Time:  Monday March 17 2024 13:35:46 EST Ventricular Rate:  51 PR Interval:  181 QRS Duration:  88 QT Interval:  450 QTC Calculation: 415 R Axis:   58  Text Interpretation: Sinus rhythm Probable left atrial enlargement Probable anteroseptal infarct, old Nonspecific T  abnormalities, lateral leads Confirmed by Ellouise Fine ([id number]) on 03/17/2024 2:04:39 PM          Medications: I ordered medication including Robaxin , oxycodone , LR.  I have reviewed the patients home medicines and have made adjustments as needed.  Critical Interventions: None  Social Determinants of Health: History of polysubstance abuse  Disposition: 3:51 PM Care of Roy Chavez transferred to Resident Roy Chavez and Dr. Zavitz at the end of my shift as the patient will require reassessment once labs/imaging have resulted. Patient presentation, ED course, and plan of care discussed with review of all pertinent labs and imaging. Please see his/her note for further details regarding further ED course and disposition. Plan at time of handoff is await UA and UDS and CT scans, dissipate likely discharge if patient is at baseline mental status.  Reassess after fluids. This may be altered or completely changed at the discretion of the oncoming team pending results of further workup.    Final diagnoses:  LLQ abdominal pain  Altered mental status, unspecified altered mental status type    ED Discharge Orders     None          Beola Terrall RAMAN, [place] 03/17/24 1551  "

## 2024-03-17 NOTE — ED Notes (Signed)
 Patient transported to CT

## 2024-03-31 ENCOUNTER — Ambulatory Visit: Admitting: Cardiovascular Disease

## 2024-04-25 ENCOUNTER — Ambulatory Visit: Admitting: Cardiovascular Disease
